# Patient Record
Sex: Female | Born: 1980 | Race: White | Hispanic: No | Marital: Single | Smoking: Never smoker
Health system: Southern US, Community
[De-identification: ages and names within clinical notes are randomized; demographics above are authoritative.]

## PROBLEM LIST (undated history)

## (undated) ENCOUNTER — Emergency Department (HOSPITAL_BASED_OUTPATIENT_CLINIC_OR_DEPARTMENT_OTHER): Payer: PRIVATE HEALTH INSURANCE

## (undated) DIAGNOSIS — F411 Generalized anxiety disorder: Secondary | ICD-10-CM

## (undated) DIAGNOSIS — T7840XA Allergy, unspecified, initial encounter: Secondary | ICD-10-CM

## (undated) DIAGNOSIS — K5090 Crohn's disease, unspecified, without complications: Secondary | ICD-10-CM

## (undated) DIAGNOSIS — F329 Major depressive disorder, single episode, unspecified: Secondary | ICD-10-CM

## (undated) DIAGNOSIS — E785 Hyperlipidemia, unspecified: Secondary | ICD-10-CM

## (undated) DIAGNOSIS — K219 Gastro-esophageal reflux disease without esophagitis: Secondary | ICD-10-CM

## (undated) HISTORY — DX: Major depressive disorder, single episode, unspecified: F32.9

## (undated) HISTORY — PX: ABDOMINAL HYSTERECTOMY: SHX81

## (undated) HISTORY — PX: BREAST SURGERY: SHX581

## (undated) HISTORY — DX: Allergy, unspecified, initial encounter: T78.40XA

## (undated) HISTORY — PX: CHOLECYSTECTOMY: SHX55

## (undated) HISTORY — DX: Generalized anxiety disorder: F41.1

## (undated) HISTORY — DX: Hyperlipidemia, unspecified: E78.5

---
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
## 2000-10-13 ENCOUNTER — Other Ambulatory Visit: Admission: RE | Admit: 2000-10-13 | Discharge: 2000-10-13 | Payer: Self-pay | Admitting: *Deleted

## 2001-06-28 DIAGNOSIS — F411 Generalized anxiety disorder: Secondary | ICD-10-CM

## 2001-06-28 HISTORY — DX: Generalized anxiety disorder: F41.1

## 2003-01-16 ENCOUNTER — Emergency Department (HOSPITAL_COMMUNITY): Admission: EM | Admit: 2003-01-16 | Discharge: 2003-01-16 | Payer: Self-pay | Admitting: Emergency Medicine

## 2003-05-09 ENCOUNTER — Other Ambulatory Visit: Admission: RE | Admit: 2003-05-09 | Discharge: 2003-05-09 | Payer: Self-pay | Admitting: Family Medicine

## 2003-08-04 ENCOUNTER — Ambulatory Visit (HOSPITAL_COMMUNITY): Admission: RE | Admit: 2003-08-04 | Discharge: 2003-08-04 | Payer: Self-pay | Admitting: Family Medicine

## 2004-02-29 DIAGNOSIS — F32A Depression, unspecified: Secondary | ICD-10-CM

## 2004-02-29 HISTORY — DX: Depression, unspecified: F32.A

## 2004-03-22 ENCOUNTER — Emergency Department (HOSPITAL_COMMUNITY): Admission: EM | Admit: 2004-03-22 | Discharge: 2004-03-23 | Payer: Self-pay | Admitting: Emergency Medicine

## 2004-10-25 ENCOUNTER — Other Ambulatory Visit: Admission: RE | Admit: 2004-10-25 | Discharge: 2004-10-25 | Payer: Self-pay | Admitting: Family Medicine

## 2005-08-10 ENCOUNTER — Ambulatory Visit: Payer: Self-pay | Admitting: Family Medicine

## 2005-08-18 ENCOUNTER — Ambulatory Visit: Payer: Self-pay | Admitting: Family Medicine

## 2005-08-18 ENCOUNTER — Other Ambulatory Visit: Admission: RE | Admit: 2005-08-18 | Discharge: 2005-08-18 | Payer: Self-pay | Admitting: Family Medicine

## 2005-08-18 LAB — HM PAP SMEAR: HM Pap smear: NEGATIVE

## 2006-05-15 ENCOUNTER — Ambulatory Visit: Payer: Self-pay | Admitting: Family Medicine

## 2006-11-06 ENCOUNTER — Emergency Department (HOSPITAL_COMMUNITY): Admission: EM | Admit: 2006-11-06 | Discharge: 2006-11-06 | Payer: Self-pay | Admitting: Emergency Medicine

## 2007-06-07 ENCOUNTER — Ambulatory Visit: Payer: Self-pay | Admitting: Family Medicine

## 2007-12-15 ENCOUNTER — Emergency Department (HOSPITAL_BASED_OUTPATIENT_CLINIC_OR_DEPARTMENT_OTHER): Admission: EM | Admit: 2007-12-15 | Discharge: 2007-12-15 | Payer: Self-pay | Admitting: Emergency Medicine

## 2008-01-06 IMAGING — CT CT PELVIS W/ CM
2 of 5 series · 17 of 46 positions shown, 19 images · IV contrast (APPLIED)
Comparison: None.

CLINICAL DATA: Abdominal pain.  Nausea.  
 ABDOMEN CT WITH CONTRAST:
TECHNIQUE: Multidetector CT imaging of the abdomen was performed following the standard protocol during bolus administration of intravenous contrast.
 Contrast:  100 cc Omnipaque 300
TECHNIQUE: Multidetector CT imaging of the pelvis was performed following the standard protocol during bolus administration of intravenous contrast.

[Series 2: abd/pelv with 5.0 b31f st · axial · 0.71mm/px · z∈[-464,-24]mm · 14 of 100 slices shown, 16 images]
[im 6/100  soft-tissue]
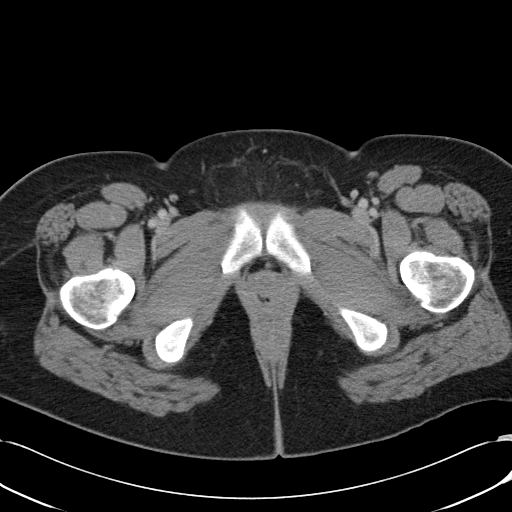
[im 6/100  bone]
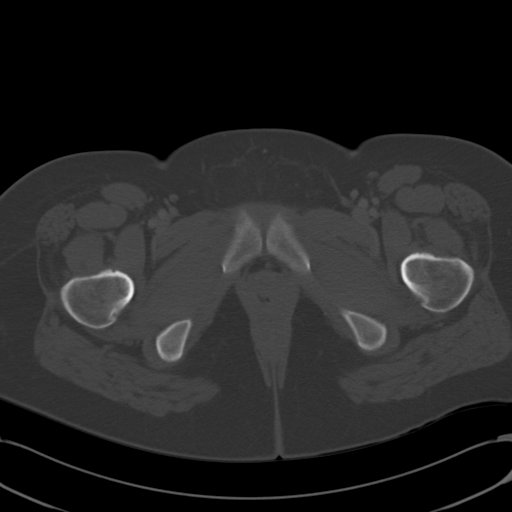
[im 12/100  soft-tissue]
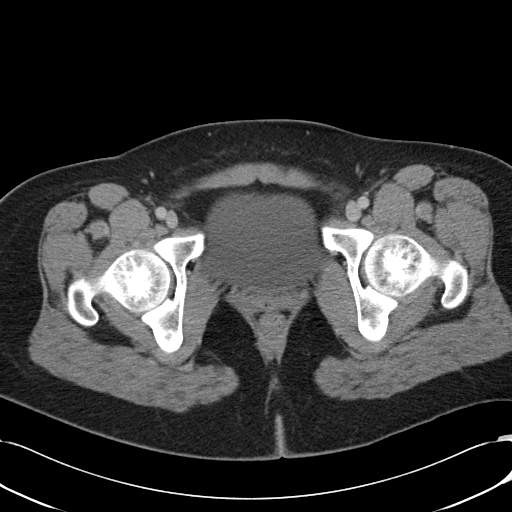
[im 23/100  soft-tissue]
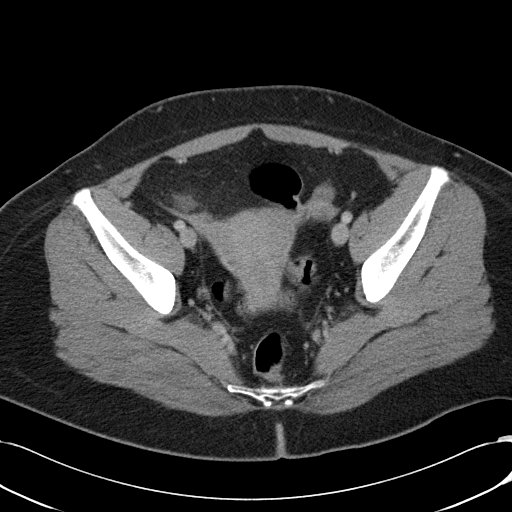
[im 28/100  soft-tissue]
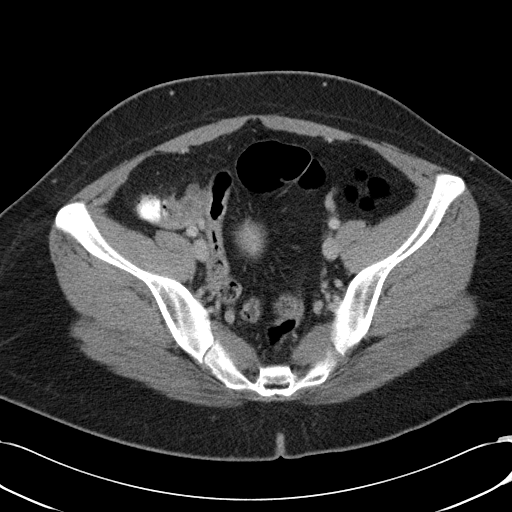
[im 34/100  soft-tissue]
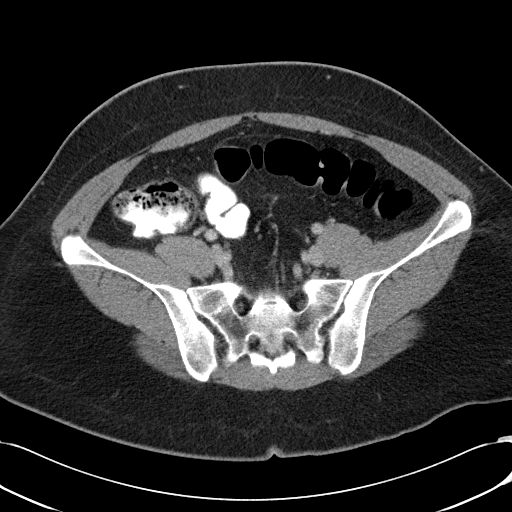
[im 39/100  soft-tissue]
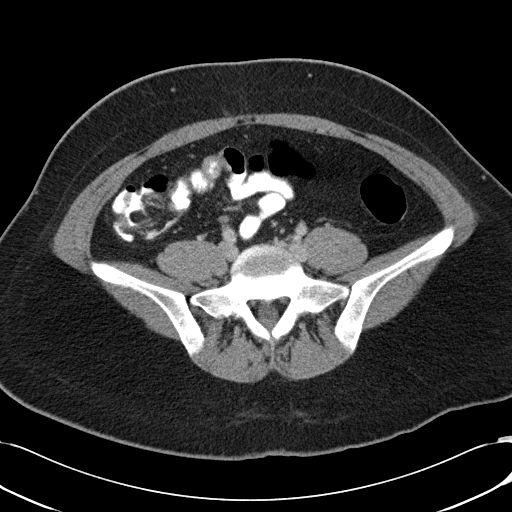
[im 45/100  soft-tissue]
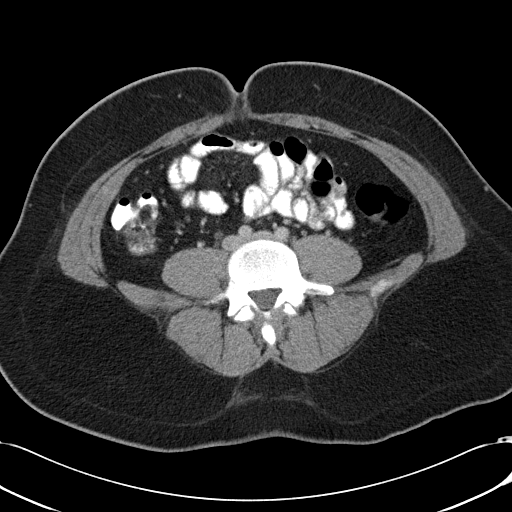
[im 56/100  soft-tissue]
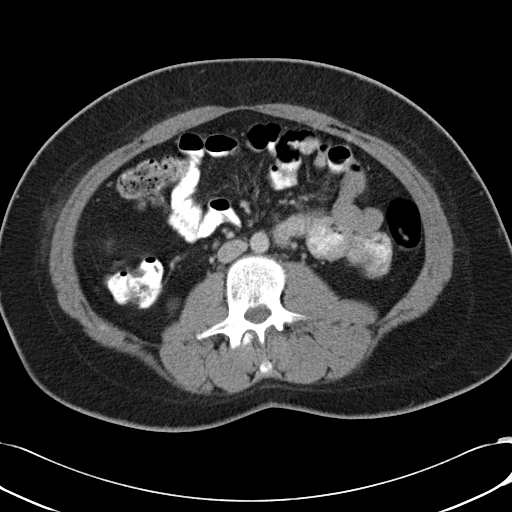
[im 61/100  soft-tissue]
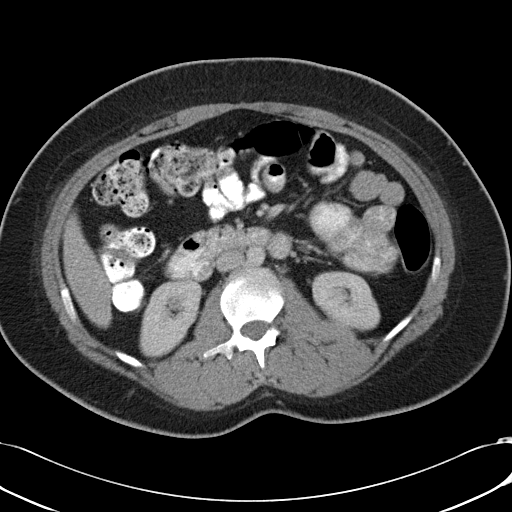
[im 61/100  bone]
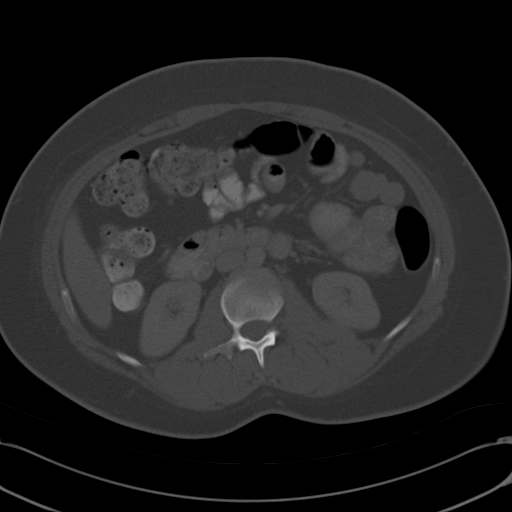
[im 67/100  soft-tissue]
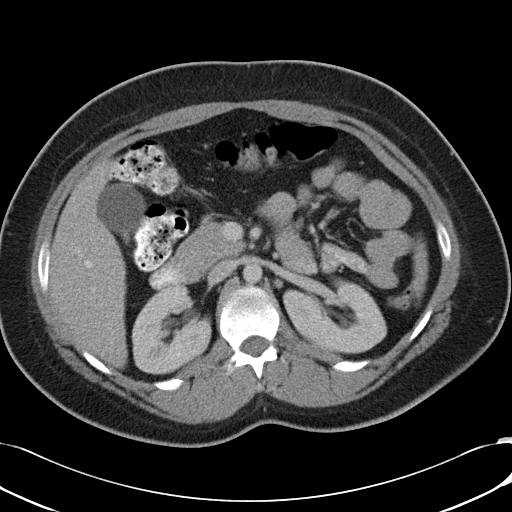
[im 72/100  soft-tissue]
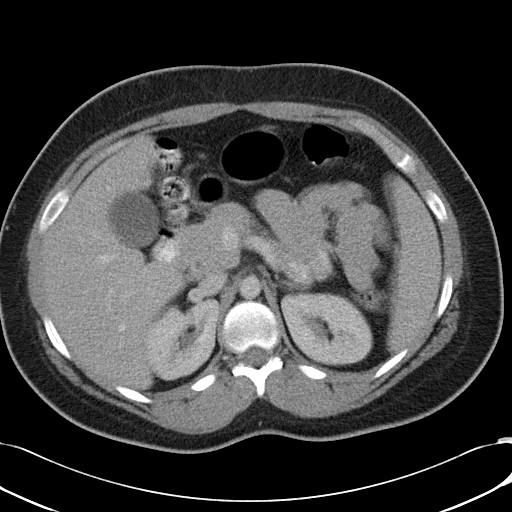
[im 78/100  soft-tissue]
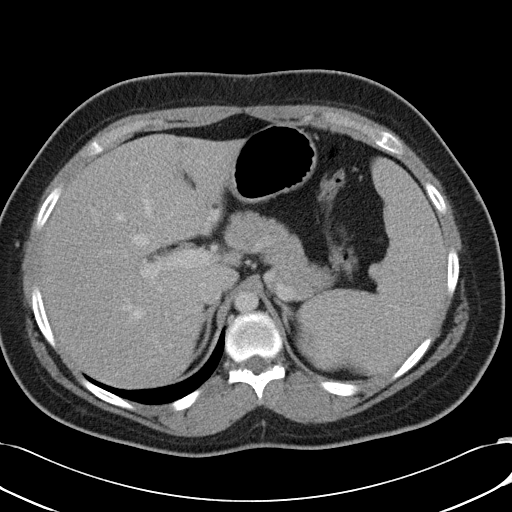
[im 89/100  soft-tissue]
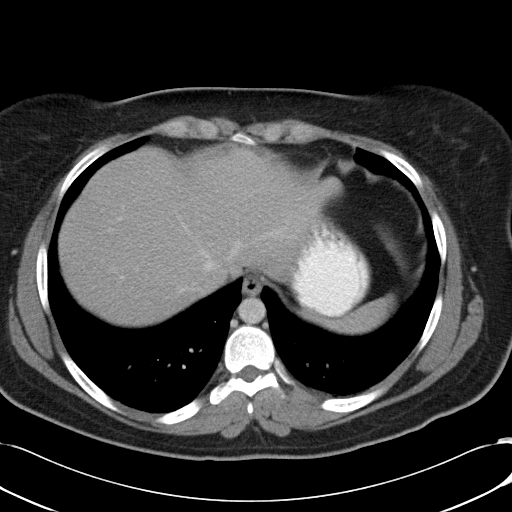
[im 94/100  soft-tissue]
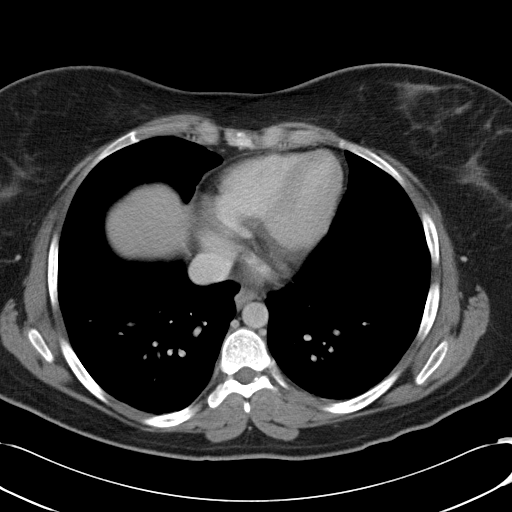

[Series 602: cor · coronal · 0.98mm/px · 3 of 112 slices shown]
[im 38/112  soft-tissue]
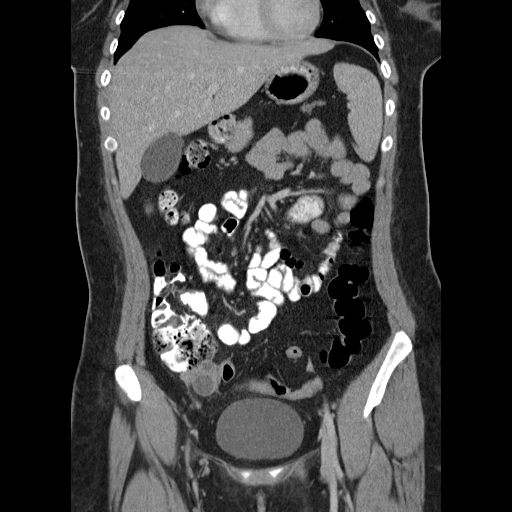
[im 50/112  soft-tissue]
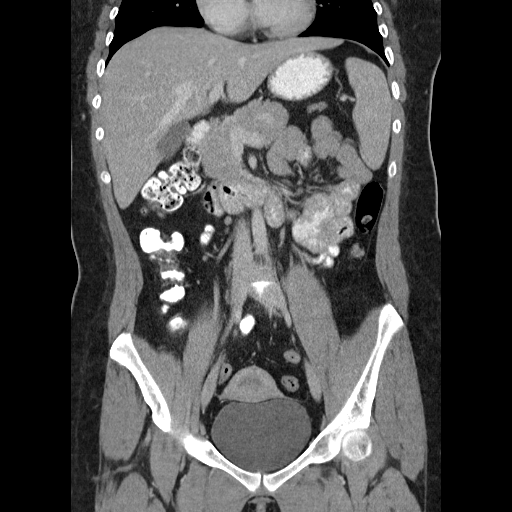
[im 62/112  soft-tissue]
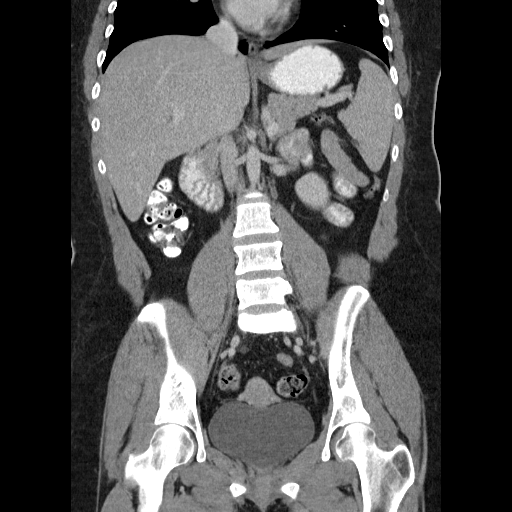

[17 of 46 positions shown; findings below may reference images not displayed]

FINDINGS: Lung bases are clear.  No pleural or pericardial fluid.  The liver has a normal appearance without focal lesions or biliary ductal dilatation.  No calcified gallstones are seen.  The spleen is normal.  Pancreas is normal.  The adrenal glands are normal.  The kidneys are normal.  The aorta and IVC are normal.  No retroperitoneal mass or adenopathy.  No free intraperitoneal fluid or air.  No bowel pathology is seen in the abdominal portion of the scan.
IMPRESSION: Normal CT scan of the abdomen.
 PELVIS CT WITH CONTRAST:
FINDINGS: No free fluid.  The uterus and adnexal regions appear normal.  No bowel pathology is seen.  The appendix is well seen and is normal.
IMPRESSION: Normal CT scan of the pelvis.

## 2008-02-17 ENCOUNTER — Emergency Department (HOSPITAL_BASED_OUTPATIENT_CLINIC_OR_DEPARTMENT_OTHER): Admission: EM | Admit: 2008-02-17 | Discharge: 2008-02-17 | Payer: Self-pay | Admitting: Emergency Medicine

## 2008-05-21 ENCOUNTER — Ambulatory Visit: Payer: Self-pay | Admitting: Family Medicine

## 2008-07-15 ENCOUNTER — Ambulatory Visit: Payer: Self-pay | Admitting: Family Medicine

## 2008-10-02 ENCOUNTER — Ambulatory Visit: Payer: Self-pay | Admitting: Family Medicine

## 2009-04-23 ENCOUNTER — Ambulatory Visit: Payer: Self-pay | Admitting: Family Medicine

## 2009-12-09 ENCOUNTER — Ambulatory Visit: Payer: Self-pay | Admitting: Family Medicine

## 2010-09-16 ENCOUNTER — Encounter: Payer: Self-pay | Admitting: Family Medicine

## 2010-12-10 LAB — POCT PREGNANCY, URINE: Operator id: 151321

## 2010-12-10 LAB — CBC
HCT: 37.1
MCHC: 33.8
MCV: 83.6
RBC: 4.44

## 2010-12-10 LAB — URINALYSIS, ROUTINE W REFLEX MICROSCOPIC
Bilirubin Urine: NEGATIVE
Glucose, UA: NEGATIVE
Hgb urine dipstick: NEGATIVE
Specific Gravity, Urine: 1.029
Urobilinogen, UA: 1

## 2010-12-10 LAB — DIFFERENTIAL
Basophils Relative: 0
Eosinophils Absolute: 0.1
Eosinophils Relative: 1
Monocytes Absolute: 0.9 — ABNORMAL HIGH
Monocytes Relative: 6
Neutrophils Relative %: 81 — ABNORMAL HIGH

## 2010-12-10 LAB — WET PREP, GENITAL: Trich, Wet Prep: NONE SEEN

## 2010-12-10 LAB — GC/CHLAMYDIA PROBE AMP, GENITAL
Chlamydia, DNA Probe: NEGATIVE
GC Probe Amp, Genital: NEGATIVE

## 2021-03-06 ENCOUNTER — Other Ambulatory Visit: Payer: Self-pay

## 2021-03-06 DIAGNOSIS — R112 Nausea with vomiting, unspecified: Secondary | ICD-10-CM | POA: Insufficient documentation

## 2021-03-06 DIAGNOSIS — R1013 Epigastric pain: Secondary | ICD-10-CM | POA: Diagnosis not present

## 2021-03-06 DIAGNOSIS — Z79899 Other long term (current) drug therapy: Secondary | ICD-10-CM | POA: Insufficient documentation

## 2021-03-06 DIAGNOSIS — R197 Diarrhea, unspecified: Secondary | ICD-10-CM | POA: Insufficient documentation

## 2021-03-07 ENCOUNTER — Encounter (HOSPITAL_BASED_OUTPATIENT_CLINIC_OR_DEPARTMENT_OTHER): Payer: Self-pay | Admitting: Emergency Medicine

## 2021-03-07 ENCOUNTER — Emergency Department (HOSPITAL_BASED_OUTPATIENT_CLINIC_OR_DEPARTMENT_OTHER)
Admission: EM | Admit: 2021-03-07 | Discharge: 2021-03-07 | Disposition: A | Discharge status: Home / Self Care | Payer: PRIVATE HEALTH INSURANCE | Attending: Emergency Medicine | Admitting: Emergency Medicine

## 2021-03-07 DIAGNOSIS — R112 Nausea with vomiting, unspecified: Secondary | ICD-10-CM

## 2021-03-07 DIAGNOSIS — R1013 Epigastric pain: Secondary | ICD-10-CM

## 2021-03-07 HISTORY — DX: Gastro-esophageal reflux disease without esophagitis: K21.9

## 2021-03-07 HISTORY — DX: Crohn's disease, unspecified, without complications: K50.90

## 2021-03-07 LAB — CBC
HCT: 37.6 % (ref 36.0–46.0)
Hemoglobin: 12.1 g/dL (ref 12.0–15.0)
MCH: 26.9 pg (ref 26.0–34.0)
MCHC: 32.2 g/dL (ref 30.0–36.0)
MCV: 83.6 fL (ref 80.0–100.0)
Platelets: 262 10*3/uL (ref 150–400)
RBC: 4.5 MIL/uL (ref 3.87–5.11)
RDW: 14.2 % (ref 11.5–15.5)
WBC: 11.5 10*3/uL — ABNORMAL HIGH (ref 4.0–10.5)
nRBC: 0 % (ref 0.0–0.2)

## 2021-03-07 LAB — COMPREHENSIVE METABOLIC PANEL
ALT: 16 U/L (ref 0–44)
AST: 13 U/L — ABNORMAL LOW (ref 15–41)
Albumin: 4.6 g/dL (ref 3.5–5.0)
Alkaline Phosphatase: 95 U/L (ref 38–126)
Anion gap: 12 (ref 5–15)
BUN: 20 mg/dL (ref 6–20)
CO2: 19 mmol/L — ABNORMAL LOW (ref 22–32)
Calcium: 9.4 mg/dL (ref 8.9–10.3)
Chloride: 105 mmol/L (ref 98–111)
Creatinine, Ser: 1.06 mg/dL — ABNORMAL HIGH (ref 0.44–1.00)
GFR, Estimated: >60 mL/min (ref >60)
Glucose, Bld: 141 mg/dL — ABNORMAL HIGH (ref 70–99)
Potassium: 4 mmol/L (ref 3.5–5.1)
Sodium: 136 mmol/L (ref 135–145)
Total Bilirubin: 1 mg/dL (ref 0.3–1.2)
Total Protein: 7.6 g/dL (ref 6.5–8.1)

## 2021-03-07 LAB — PREGNANCY, URINE: Preg Test, Ur: NEGATIVE

## 2021-03-07 LAB — LIPASE, BLOOD: Lipase: 56 U/L — ABNORMAL HIGH (ref 11–51)

## 2021-03-07 MED ORDER — LACTATED RINGERS IV BOLUS
1000.0000 mL | Freq: Once | INTRAVENOUS | Status: AC
  Administered 2021-03-07: 1000 mL via INTRAVENOUS

## 2021-03-07 MED ORDER — FENTANYL CITRATE PF 50 MCG/ML IJ SOSY
100.0000 ug | PREFILLED_SYRINGE | Freq: Once | INTRAMUSCULAR | Status: AC | PRN
Start: 2021-03-07 — End: 2021-03-07
  Administered 2021-03-07: 100 ug via INTRAVENOUS
  Filled 2021-03-07: qty 2

## 2021-03-07 MED ORDER — SODIUM CHLORIDE 0.9 % IV SOLN
12.5000 mg | Freq: Once | INTRAVENOUS | Status: AC
  Administered 2021-03-07: 12.5 mg via INTRAVENOUS
  Filled 2021-03-07: qty 0.5

## 2021-03-07 MED ORDER — PROMETHAZINE HCL 25 MG/ML IJ SOLN
INTRAMUSCULAR | Status: AC
  Filled 2021-03-07: qty 1

## 2021-03-07 MED ORDER — ONDANSETRON HCL 4 MG/2ML IJ SOLN
4.0000 mg | Freq: Once | INTRAMUSCULAR | Status: AC
  Administered 2021-03-07: 4 mg via INTRAVENOUS
  Filled 2021-03-07: qty 2

## 2021-03-07 MED ORDER — PROMETHAZINE HCL 25 MG PO TABS
25.0000 mg | ORAL_TABLET | Freq: Four times a day (QID) | ORAL | 0 refills | Status: AC | PRN
Start: 2021-03-07 — End: ?

## 2021-03-07 NOTE — ED Provider Notes (Signed)
[facility] Provider Note: Georgena Spurling, MD, FACEP  CSN: [id number] MRN: [id number] ARRIVAL: 03/06/21 at 2353 ROOM: DB013/DB013   CHIEF COMPLAINT  Abdominal Pain   HISTORY OF PRESENT ILLNESS  03/07/21 2:29 AM Ann Leonard is a 41 y.o. female with a history of Crohn's disease. She has had epigastric abdominal pain for about the last 3 weeks.  The pain has been waxing and waning.  She describes it as kind of sharp and pulling.  She had an abdominal CT on 03/02/2021 which was unremarkable.  She has been taking hyoscyamine without relief.  Yesterday the pain acutely worsened and she now rates it as an 8 out of 10.  It is somewhat worse with palpation.  She has had associated nausea, vomiting and diarrhea which her symptoms she did not previously have.  Her temperature was noted to be 99.7 on arrival.  She has been taking Zofran without relief of the nausea and vomiting.   Past Medical History:  Diagnosis Date   Allergy    RHINITIS   Contraceptive management    Crohn disease ([place])    Depression 02/29/2004   Elevated lipids    GAD (generalized anxiety disorder) 06/28/2001   GERD (gastroesophageal reflux disease)     History reviewed. No pertinent surgical history.  History reviewed. No pertinent family history.     Prior to Admission medications   Medication Sig Start Date End Date Taking? Authorizing Provider  promethazine (PHENERGAN) 25 MG tablet Take 1 tablet (25 mg total) by mouth every 6 (six) hours as needed for nausea or vomiting. 03/07/21  Yes Marilena Trevathan, MD  buPROPion (WELLBUTRIN XL) 150 MG 24 hr tablet Take 150 mg by mouth 2 (two) times daily.      [provider]  escitalopram (LEXAPRO) 10 MG tablet Take 10 mg by mouth daily.      [provider]  norethindrone-ethinyl estradiol (JUNEL FE 1/20) 1-20 MG-MCG per tablet Take 1 tablet by mouth daily.      [provider]    Allergies Patient has no known allergies.   REVIEW OF  SYSTEMS  Negative except as noted here or in the History of Present Illness.   PHYSICAL EXAMINATION  Initial Vital Signs Blood pressure 121/74, pulse 93, temperature 99.7 F (37.6 C), temperature source Oral, resp. rate 18, height 5\' 5"  (1.651 m), weight 93.9 kg, SpO2 99 %.  Examination General: Well-developed, well-nourished female in no acute distress; appearance consistent with age of record HENT: normocephalic; atraumatic Eyes: Normal appearance Neck: supple Heart: regular rate and rhythm Lungs: clear to auscultation bilaterally Abdomen: soft; nondistended; epigastric tenderness; bowel sounds present Extremities: No deformity; full range of motion; pulses normal Neurologic: Awake, alert and oriented; motor function intact in all extremities and symmetric; no facial droop Skin: Warm and dry Psychiatric: Normal mood and affect   RESULTS  Summary of this visit's results, reviewed and interpreted by myself:   EKG Interpretation  Date/Time:  Sunday March 07 2021 00:35:19 EST Ventricular Rate:  92 PR Interval:  146 QRS Duration: 74 QT Interval:  348 QTC Calculation: 430 R Axis:   0 Text Interpretation: Normal sinus rhythm Normal ECG No previous ECGs available Confirmed by Huck Ashworth, Jenny Reichmann [phone]) on 03/07/2021 12:50:05 AM       Laboratory Studies: Results for orders placed or performed during the hospital encounter of 03/07/21 (from the past 24 hour(s))  Pregnancy, urine     Status: None   Collection Time: 03/07/21 12:29 AM  Result Value Ref Range   Preg Test, Ur NEGATIVE NEGATIVE  Lipase, blood     Status: Abnormal   Collection Time: 03/07/21 12:40 AM  Result Value Ref Range   Lipase 56 (H) 11 - 51 U/L  Comprehensive metabolic panel     Status: Abnormal   Collection Time: 03/07/21 12:40 AM  Result Value Ref Range   Sodium 136 135 - 145 mmol/L   Potassium 4.0 3.5 - 5.1 mmol/L   Chloride 105 98 - 111 mmol/L   CO2 19 (L) 22 - 32 mmol/L   Glucose, Bld 141 (H) 70 - 99  mg/dL   BUN 20 6 - 20 mg/dL   Creatinine, Ser 1.06 (H) 0.44 - 1.00 mg/dL   Calcium 9.4 8.9 - 10.3 mg/dL   Total Protein 7.6 6.5 - 8.1 g/dL   Albumin 4.6 3.5 - 5.0 g/dL   AST 13 (L) 15 - 41 U/L   ALT 16 0 - 44 U/L   Alkaline Phosphatase 95 38 - 126 U/L   Total Bilirubin 1.0 0.3 - 1.2 mg/dL   GFR, Estimated >60 >60 mL/min   Anion gap 12 5 - 15  CBC     Status: Abnormal   Collection Time: 03/07/21 12:40 AM  Result Value Ref Range   WBC 11.5 (H) 4.0 - 10.5 K/uL   RBC 4.50 3.87 - 5.11 MIL/uL   Hemoglobin 12.1 12.0 - 15.0 g/dL   HCT 37.6 36.0 - 46.0 %   MCV 83.6 80.0 - 100.0 fL   MCH 26.9 26.0 - 34.0 pg   MCHC 32.2 30.0 - 36.0 g/dL   RDW 14.2 11.5 - 15.5 %   Platelets 262 150 - 400 K/uL   nRBC 0.0 0.0 - 0.2 %   Imaging Studies: No results found.  ED COURSE and MDM  Nursing notes, initial and subsequent vitals signs, including pulse oximetry, reviewed and interpreted by myself.  Vitals:   03/07/21 0027 03/07/21 0300  BP: 121/74 120/68  Pulse: 93 77  Resp: 18 (!) 28  Temp: 99.7 F (37.6 C)   TempSrc: Oral   SpO2: 99% 97%  Weight: 93.9 kg   Height: 5\' 5"  (1.651 m)    Medications  promethazine (PHENERGAN) 25 MG/ML injection (has no administration in time range)  lactated ringers bolus 1,000 mL (1,000 mLs Intravenous New Bag/Given 03/07/21 0303)  ondansetron (ZOFRAN) injection 4 mg (4 mg Intravenous Given 03/07/21 0304)  fentaNYL (SUBLIMAZE) injection 100 mcg (100 mcg Intravenous Given 03/07/21 0304)  promethazine (PHENERGAN) 12.5 mg in sodium chloride 0.9 % 50 mL IVPB (12.5 mg Intravenous New Bag/Given 03/07/21 0415)   4:42 AM Nausea controlled with Zofran and Phenergan.  Patient was given fentanyl 100 mcg earlier.  Her pain is now minimal and she has minimal epigastric tenderness.  Although her lipase is slightly elevated at 56 her pain does not radiate into her back and I have a low suspicion of this being pancreatitis.  Also she had a CT scan several days ago which showed a  normal-appearing pancreas in the setting of persistent pain.  I do not feel that she needs another CT scan at this time.  She would like to go home at this time and we will provide a Phenergan prescription (she has Zofran at home).  She was advised to return if pain or other symptoms worsen.  The nausea, vomiting and diarrhea could represent a gastrointestinal virus.  The focality of her pain makes me think it less likely this  is a Crohn's flare especially given her unremarkable recent CT scan in the setting of persistent pain.  PROCEDURES  Procedures   ED DIAGNOSES     ICD-10-CM   1. Epigastric pain  R10.13     2. Nausea vomiting and diarrhea  R11.2    R19.7          Sabian Kuba, Jenny Reichmann, MD 03/07/21 [phone]

## 2021-03-07 NOTE — ED Triage Notes (Signed)
Pt presents from home for upper abd pain, emesis x4, diarrhea. Has been experiencing abd pain for 3 weeks, previous blood work and CT done for same, showed periumbilical hernia. N/V/D new today. Denies blood in stool, noticed blood tinged emesis.  Has tried hyoscyamine and zofran without relief. Last PO intake 6pm. H/o GERD, Chrohn's

## 2021-05-13 ENCOUNTER — Other Ambulatory Visit: Payer: Self-pay

## 2021-05-13 ENCOUNTER — Emergency Department (HOSPITAL_BASED_OUTPATIENT_CLINIC_OR_DEPARTMENT_OTHER): Payer: PRIVATE HEALTH INSURANCE

## 2021-05-13 ENCOUNTER — Encounter (HOSPITAL_BASED_OUTPATIENT_CLINIC_OR_DEPARTMENT_OTHER): Payer: Self-pay | Admitting: Emergency Medicine

## 2021-05-13 ENCOUNTER — Emergency Department (HOSPITAL_BASED_OUTPATIENT_CLINIC_OR_DEPARTMENT_OTHER)
Admission: EM | Admit: 2021-05-13 | Discharge: 2021-05-14 | Disposition: A | Discharge status: Home / Self Care | Payer: PRIVATE HEALTH INSURANCE | Attending: Emergency Medicine | Admitting: Emergency Medicine

## 2021-05-13 DIAGNOSIS — R1013 Epigastric pain: Secondary | ICD-10-CM | POA: Insufficient documentation

## 2021-05-13 DIAGNOSIS — R197 Diarrhea, unspecified: Secondary | ICD-10-CM | POA: Insufficient documentation

## 2021-05-13 DIAGNOSIS — R110 Nausea: Secondary | ICD-10-CM | POA: Insufficient documentation

## 2021-05-13 DIAGNOSIS — K219 Gastro-esophageal reflux disease without esophagitis: Secondary | ICD-10-CM | POA: Insufficient documentation

## 2021-05-13 LAB — URINALYSIS, ROUTINE W REFLEX MICROSCOPIC
Bilirubin Urine: NEGATIVE
Glucose, UA: NEGATIVE mg/dL
Ketones, ur: NEGATIVE mg/dL
Leukocytes,Ua: NEGATIVE
Nitrite: NEGATIVE
Protein, ur: 30 mg/dL — AB
RBC / HPF: >50 RBC/hpf — ABNORMAL HIGH (ref 0–5)
Specific Gravity, Urine: 1.034 — ABNORMAL HIGH (ref 1.005–1.030)
pH: 5.5 (ref 5.0–8.0)

## 2021-05-13 LAB — COMPREHENSIVE METABOLIC PANEL
ALT: 11 U/L (ref 0–44)
AST: 14 U/L — ABNORMAL LOW (ref 15–41)
Albumin: 4.7 g/dL (ref 3.5–5.0)
Alkaline Phosphatase: 92 U/L (ref 38–126)
Anion gap: 9 (ref 5–15)
BUN: 20 mg/dL (ref 6–20)
CO2: 23 mmol/L (ref 22–32)
Calcium: 9.8 mg/dL (ref 8.9–10.3)
Chloride: 105 mmol/L (ref 98–111)
Creatinine, Ser: 0.9 mg/dL (ref 0.44–1.00)
GFR, Estimated: >60 mL/min (ref >60)
Glucose, Bld: 89 mg/dL (ref 70–99)
Potassium: 3.9 mmol/L (ref 3.5–5.1)
Sodium: 137 mmol/L (ref 135–145)
Total Bilirubin: 1.5 mg/dL — ABNORMAL HIGH (ref 0.3–1.2)
Total Protein: 8.1 g/dL (ref 6.5–8.1)

## 2021-05-13 LAB — CBC
HCT: 41.6 % (ref 36.0–46.0)
Hemoglobin: 13.2 g/dL (ref 12.0–15.0)
MCH: 25.6 pg — ABNORMAL LOW (ref 26.0–34.0)
MCHC: 31.7 g/dL (ref 30.0–36.0)
MCV: 80.8 fL (ref 80.0–100.0)
Platelets: 233 10*3/uL (ref 150–400)
RBC: 5.15 MIL/uL — ABNORMAL HIGH (ref 3.87–5.11)
RDW: 15.7 % — ABNORMAL HIGH (ref 11.5–15.5)
WBC: 6.7 10*3/uL (ref 4.0–10.5)
nRBC: 0 % (ref 0.0–0.2)

## 2021-05-13 LAB — LIPASE, BLOOD: Lipase: 18 U/L (ref 11–51)

## 2021-05-13 LAB — PREGNANCY, URINE: Preg Test, Ur: NEGATIVE

## 2021-05-13 LAB — C DIFFICILE QUICK SCREEN W PCR REFLEX
C Diff antigen: NEGATIVE
C Diff interpretation: NOT DETECTED
C Diff toxin: NEGATIVE

## 2021-05-13 MED ORDER — KETOROLAC TROMETHAMINE 15 MG/ML IJ SOLN
15.0000 mg | Freq: Once | INTRAMUSCULAR | Status: AC
  Administered 2021-05-13: 15 mg via INTRAVENOUS
  Filled 2021-05-13: qty 1

## 2021-05-13 MED ORDER — IOHEXOL 300 MG/ML  SOLN
100.0000 mL | Freq: Once | INTRAMUSCULAR | Status: AC | PRN
  Administered 2021-05-13: 100 mL via INTRAVENOUS

## 2021-05-13 MED ORDER — LACTATED RINGERS IV BOLUS
1500.0000 mL | Freq: Once | INTRAVENOUS | Status: AC
Start: 2021-05-13 — End: 2021-05-13
  Administered 2021-05-13: 1500 mL via INTRAVENOUS

## 2021-05-13 MED ORDER — ONDANSETRON HCL 4 MG/2ML IJ SOLN
4.0000 mg | Freq: Once | INTRAMUSCULAR | Status: AC
  Administered 2021-05-13: 4 mg via INTRAVENOUS
  Filled 2021-05-13: qty 2

## 2021-05-13 NOTE — ED Provider Notes (Signed)
?[facility] EM[facility] DEPT ?Provider Note ? ? ?CSN: [id number] ?Arrival date & time: 05/13/21  1609 ? ?  ? ?History ? ?Chief Complaint  ?Patient presents with  ? Diarrhea  ? ? ?Ann Leonard is a 41 y.o. female. ? ?HPI ?Patient is a 41 year old female with a history of Crohn's disease on Entyvio as well as GERD who presents to the emergency department due to abdominal pain.  Patient states that she recently had a C. difficile exposure last week with her mother.  2 days ago she began developing upper abdominal pain as well as nausea and persistent diarrhea.  States that she has having greater than 10 bouts of watery brown diarrhea per day.  Also notes blood when wiping.  Denies any URI symptoms, vomiting, chest pain, shortness of breath, urinary complaints. ?  ? ?Home Medications ?Prior to Admission medications   ?Medication Sig Start Date End Date Taking? Authorizing Provider  ?buPROPion (WELLBUTRIN XL) 150 MG 24 hr tablet Take 150 mg by mouth 2 (two) times daily.      [provider]  ?escitalopram (LEXAPRO) 10 MG tablet Take 10 mg by mouth daily.      [provider]  ?norethindrone-ethinyl estradiol (JUNEL FE 1/20) 1-20 MG-MCG per tablet Take 1 tablet by mouth daily.      [provider]  ?promethazine (PHENERGAN) 25 MG tablet Take 1 tablet (25 mg total) by mouth every 6 (six) hours as needed for nausea or vomiting. 03/07/21   Molpus, Jonny Ruiz, MD  ?   ? ?Allergies    ?Topamax [topiramate]   ? ?Review of Systems   ?Review of Systems  ?All other systems reviewed and are negative. ?Ten systems reviewed and are negative for acute change, except as noted in the HPI.   ?Physical Exam ?Updated Vital Signs ?BP (!) 86/49 (BP Location: Right Arm)   Pulse 61   Temp 98.8 ?F (37.1 ?C) (Oral)   Resp 16   Ht 5\' 5"  (1.651 m)   Wt 89.8 kg   SpO2 98%   BMI 32.95 kg/m?  ?Physical Exam ?Vitals and nursing note reviewed.  ?Constitutional:   ?   General: She is not in acute distress. ?    Appearance: Normal appearance. She is not ill-appearing, toxic-appearing or diaphoretic.  ?HENT:  ?   Head: Normocephalic and atraumatic.  ?   Right Ear: External ear normal.  ?   Left Ear: External ear normal.  ?   Nose: Nose normal.  ?   Mouth/Throat:  ?   Mouth: Mucous membranes are moist.  ?   Pharynx: Oropharynx is clear. No oropharyngeal exudate or posterior oropharyngeal erythema.  ?Eyes:  ?   General: No scleral icterus.    ?   Right eye: No discharge.     ?   Left eye: No discharge.  ?   Extraocular Movements: Extraocular movements intact.  ?   Conjunctiva/sclera: Conjunctivae normal.  ?Cardiovascular:  ?   Rate and Rhythm: Normal rate and regular rhythm.  ?   Pulses: Normal pulses.  ?   Heart sounds: Normal heart sounds. No murmur heard. ?  No friction rub. No gallop.  ?Pulmonary:  ?   Effort: Pulmonary effort is normal. No respiratory distress.  ?   Breath sounds: Normal breath sounds. No stridor. No wheezing, rhonchi or rales.  ?Abdominal:  ?   General: Abdomen is flat.  ?   Palpations: Abdomen is soft.  ?   Tenderness: There is abdominal tenderness.  ?  Comments: Abdomen is flat and soft.  Mild to moderate tenderness noted overlying the epigastrium.  ?Musculoskeletal:     ?   General: Normal range of motion.  ?   Cervical back: Normal range of motion and neck supple. No tenderness.  ?Skin: ?   General: Skin is warm and dry.  ?Neurological:  ?   General: No focal deficit present.  ?   Mental Status: She is alert and oriented to person, place, and time.  ?Psychiatric:     ?   Mood and Affect: Mood normal.     ?   Behavior: Behavior normal.  ? ?ED Results / Procedures / Treatments   ?Labs ?(all labs ordered are listed, but only abnormal results are displayed) ?Labs Reviewed  ?COMPREHENSIVE METABOLIC PANEL - Abnormal; Notable for the following components:  ?    Result Value  ? AST 14 (*)   ? Total Bilirubin 1.5 (*)   ? All other components within normal limits  ?CBC - Abnormal; Notable for the following  components:  ? RBC 5.15 (*)   ? MCH 25.6 (*)   ? RDW 15.7 (*)   ? All other components within normal limits  ?URINALYSIS, ROUTINE W REFLEX MICROSCOPIC - Abnormal; Notable for the following components:  ? APPearance CLOUDY (*)   ? Specific Gravity, Urine 1.034 (*)   ? Hgb urine dipstick LARGE (*)   ? Protein, ur 30 (*)   ? RBC / HPF >50 (*)   ? All other components within normal limits  ?C DIFFICILE QUICK SCREEN W PCR REFLEX    ?GASTROINTESTINAL PANEL BY PCR, STOOL (REPLACES STOOL CULTURE)  ?LIPASE, BLOOD  ?PREGNANCY, URINE  ? ? ?EKG ?None ? ?Radiology ?CT ABDOMEN PELVIS W CONTRAST ? ?Result Date: 05/13/2021 ?CLINICAL DATA:  Abdominal pain, acute, nonlocalized. History of Crohn's disease. Epigastric pain with persistent diarrhea. EXAM: CT ABDOMEN AND PELVIS WITH CONTRAST TECHNIQUE: Multidetector CT imaging of the abdomen and pelvis was performed using the standard protocol following bolus administration of intravenous contrast. RADIATION DOSE REDUCTION: This exam was performed according to the departmental dose-optimization program which includes automated exposure control, adjustment of the mA and/or kV according to patient size and/or use of iterative reconstruction technique. CONTRAST:  100mL OMNIPAQUE IOHEXOL 300 MG/ML  SOLN COMPARISON:  11/06/2006.  03/23/2004 FINDINGS: Lower chest: Lung bases are clear. Hepatobiliary: Liver parenchyma is normal.  Previous cholecystectomy Pancreas: Normal Spleen: Upper limits of normal in size. No significant change since 2006. Adrenals/Urinary Tract: Adrenal glands are normal. Left kidney is normal. Right kidney is normal except for a 1 cm cyst in the upper pole. No stone disease. No hydronephrosis. The bladder is collapsed but normal. Stomach/Bowel: Stomach and small intestine are normal. No evidence of active Crohn's disease. Liquid stool within the colon. No evidence of colon inflammation, mass or obstruction. Vascular/Lymphatic: Aortic atherosclerosis, mild. No aneurysm. IVC  is normal. No adenopathy. Reproductive: IUD present within the uterus. No adnexal mass. 4.1 cm cyst associated with the left ovary. Other: No free fluid or air. Musculoskeletal: Normal except for mild spinal curvature. IMPRESSION: No sign of active Crohn's disease. Liquid stool in the colon consistent with diarrhea, without evidence colon inflammation. Previous cholecystectomy. IUD in place. 4.2 cm cyst associated with the left ovary. No follow-up imaging recommended. Note: This recommendation does not apply to premenarchal patients and to those with increased risk (genetic, family history, elevated tumor markers or other high-risk factors) of ovarian cancer. Reference: JACR 2020 Feb; 17(2):248-254 Electronically Signed  By: Paulina Fusi M.D.   On: 05/13/2021 18:41   ? ?Procedures ?Procedures  ? ?Medications Ordered in ED ?Medications  ?lactated ringers bolus 1,500 mL (0 mLs Intravenous Stopped 05/13/21 2145)  ?ondansetron [facility]) injection 4 mg (4 mg Intravenous Given 05/13/21 1817)  ?iohexol (OMNIPAQUE) 300 MG/ML solution 100 mL (100 mLs Intravenous Contrast Given 05/13/21 1833)  ? ? ?ED Course/ Medical Decision Making/ A&P ?  ?                        ?Medical Decision Making ?Amount and/or Complexity of Data Reviewed ?Labs: ordered. ?Radiology: ordered. ? ?Risk ?Prescription drug management. ? ?Pt is a 41 y.o. female with a history of Crohn's disease who presents to the emergency department due to diarrhea.  Symptoms started about 2 days ago.  Also reports epigastric pain as well as a previous C. difficile contact last week. ? ?Labs: ?CBC with RBCs of 5.15, MCH of 25.6, RDW of 15.7. ?CMP with an AST of 14 and a total bilirubin of 1.5. ?UA with large hemoglobin, 30 protein, greater than 50 RBCs. ?Urine pregnancy is negative. ?Lipase of 18. ?C. difficile quick screen with PCR reflex is in process. ?Gastrointestinal panel by PCR is in process. ? ?Imaging: ?CT scan of the abdomen/pelvis shows  IMPRESSION: No sign of  active Crohn's disease. Liquid stool in the colon consistent with diarrhea, without evidence colon inflammation. Previous cholecystectomy. IUD in place. 4.2 cm cyst associated with the left ovary. No follow-up i

## 2021-05-13 NOTE — ED Triage Notes (Signed)
Pt was exposed to a person who has c.diff. last Monday. Pt. Then developed watery stools with several episodes of diarrhea.  Pt was sent by UC due to same and dehydration. Pt also has chron's and on entyvio for same.  ?

## 2021-05-13 NOTE — Discharge Instructions (Signed)
Please check MyChart results regarding your C. difficile testing.  If positive you will need additional treatment.  Strongly recommend following up with your primary care doctor.  Come back to ER if you develop worsening vomiting, uncontrolled pain or other new concerning symptoms. ?

## 2021-05-14 LAB — GASTROINTESTINAL PANEL BY PCR, STOOL (REPLACES STOOL CULTURE)

## 2022-07-13 IMAGING — CT CT ABD-PELV W/ CM
2 of 5 series · 16 of 46 positions shown, 18 images · IV contrast (APPLIED)
Comparison: 11/06/2006.  03/23/2004

CLINICAL DATA: Abdominal pain, acute, nonlocalized. History of
Crohn's disease. Epigastric pain with persistent diarrhea.

EXAM:
CT ABDOMEN AND PELVIS WITH CONTRAST
TECHNIQUE: Multidetector CT imaging of the abdomen and pelvis was performed
using the standard protocol following bolus administration of
intravenous contrast.

[Series 2: abd pel w · axial · 0.90mm/px · z∈[+983,+1433]mm · 13 of 102 slices shown, 15 images]
[im 6/102  soft-tissue]
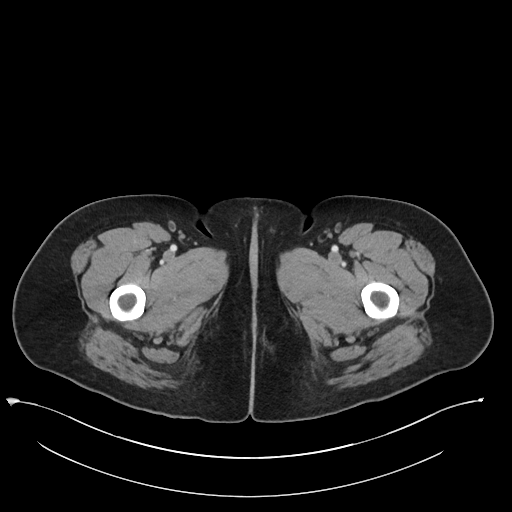
[im 6/102  bone]
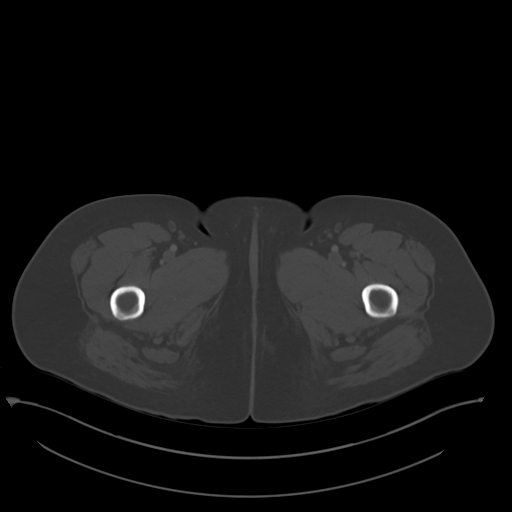
[im 16/102  soft-tissue]
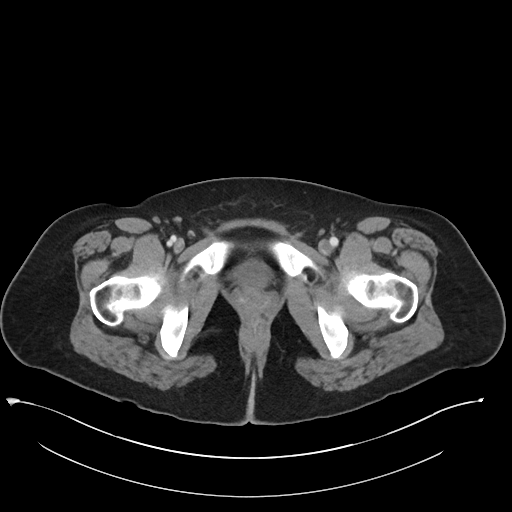
[im 21/102  soft-tissue]
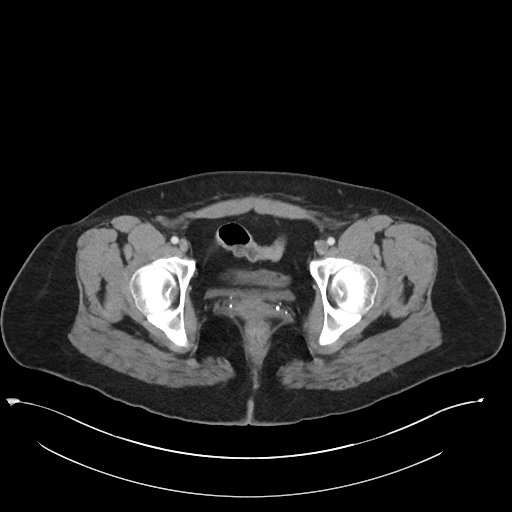
[im 31/102  soft-tissue]
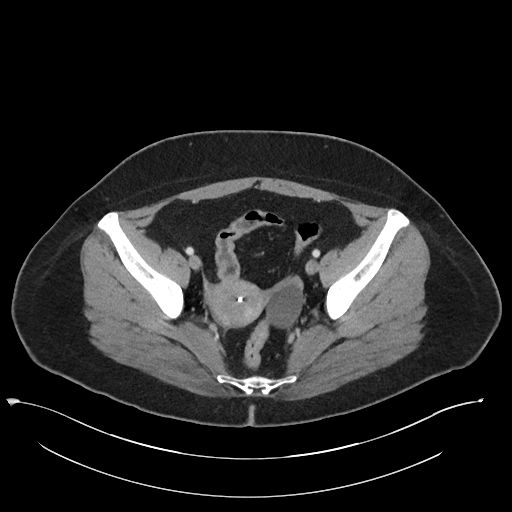
[im 36/102  soft-tissue]
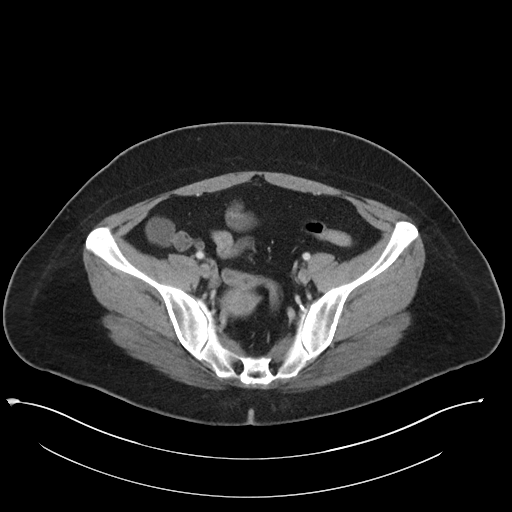
[im 46/102  soft-tissue]
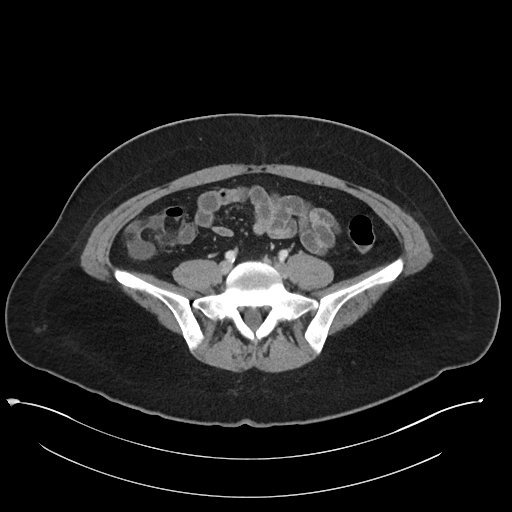
[im 51/102  soft-tissue]
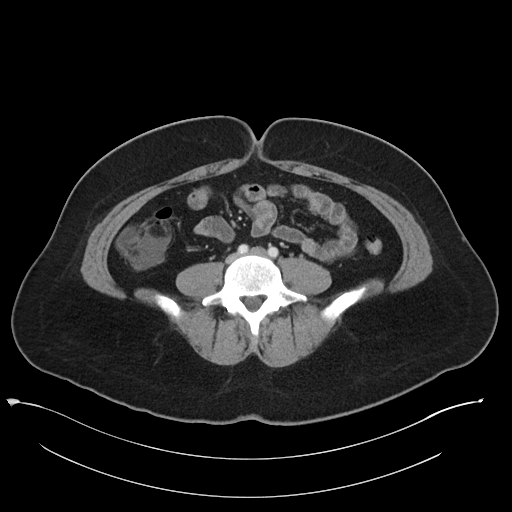
[im 56/102  soft-tissue]
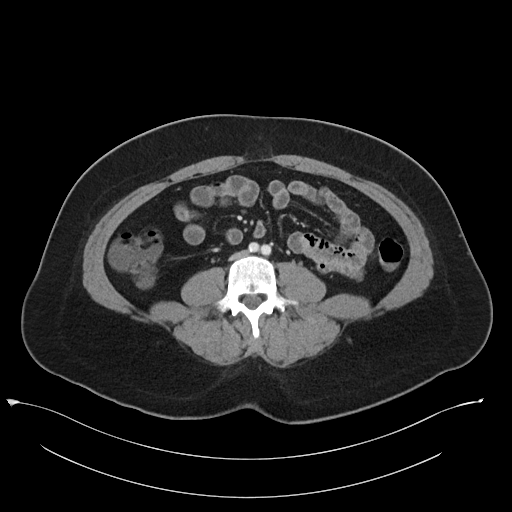
[im 66/102  soft-tissue]
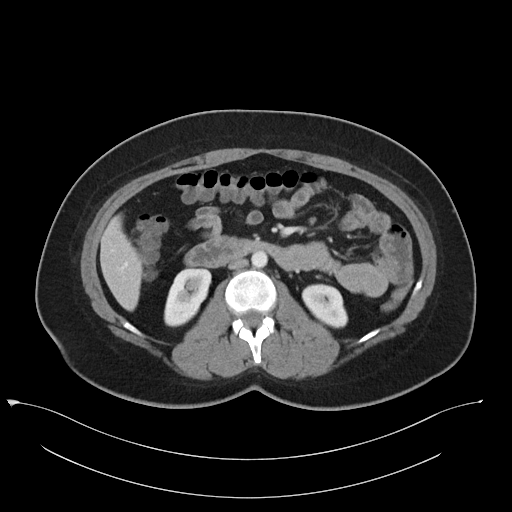
[im 66/102  bone]
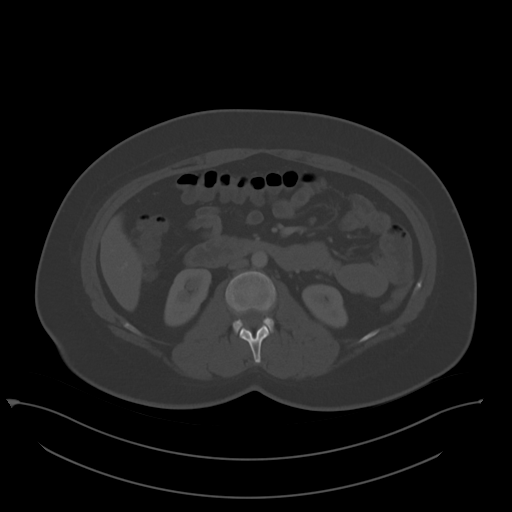
[im 71/102  soft-tissue]
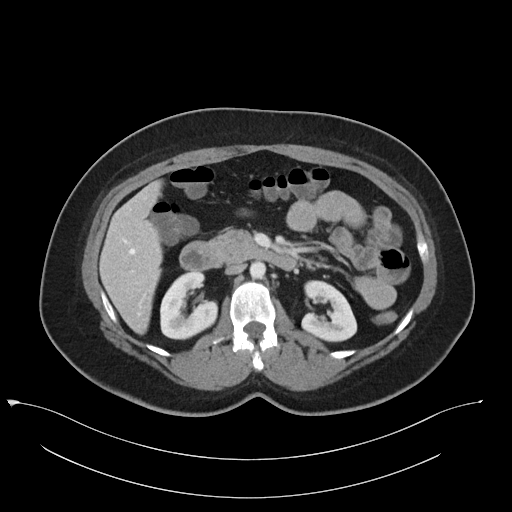
[im 81/102  soft-tissue]
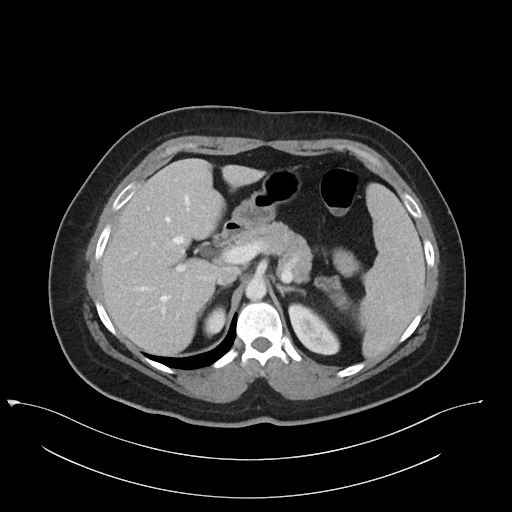
[im 86/102  soft-tissue]
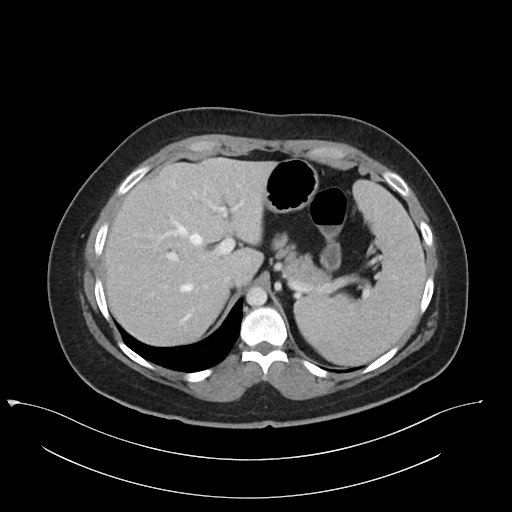
[im 96/102  soft-tissue]
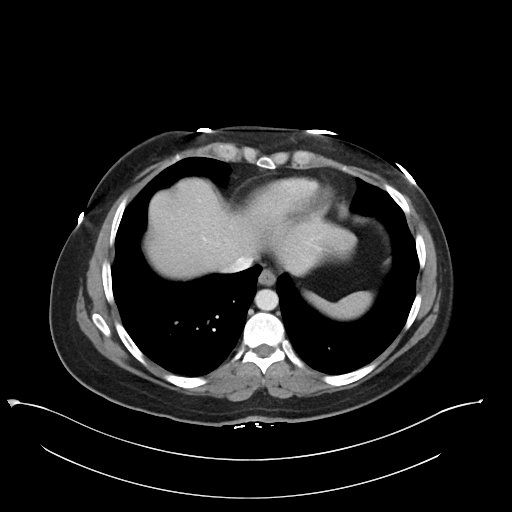

[Series 5: coronal · coronal · 0.89mm/px · 3 of 98 slices shown]
[im 33/98  soft-tissue]
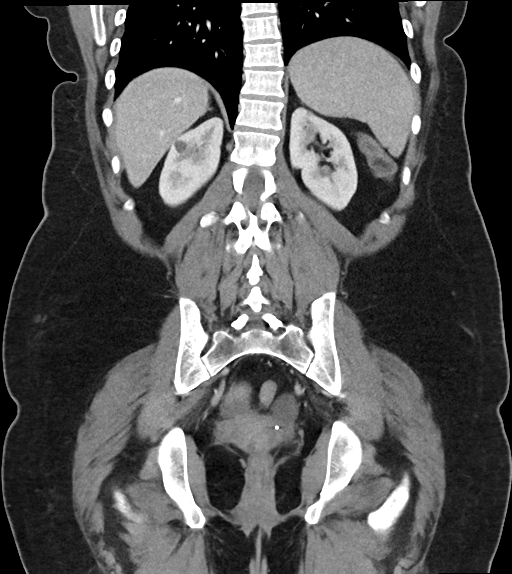
[im 44/98  soft-tissue]
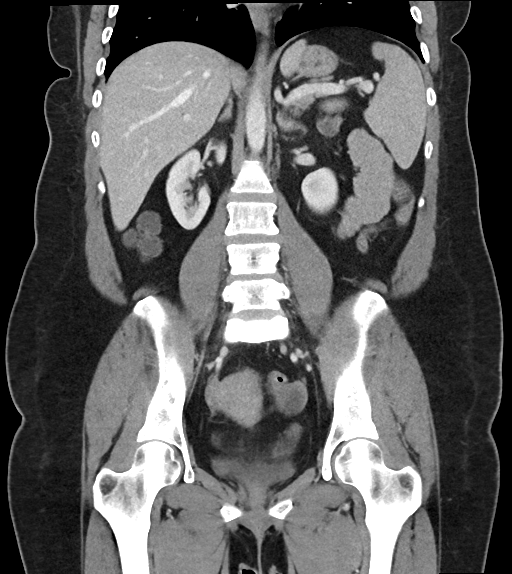
[im 54/98  soft-tissue]
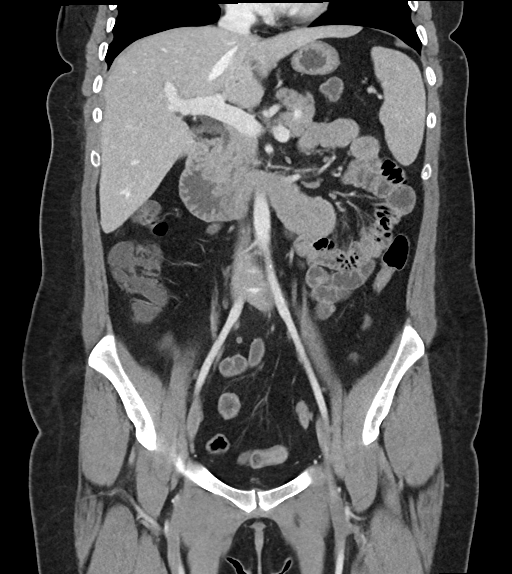

[16 of 46 positions shown; findings below may reference images not displayed]

RADIATION DOSE REDUCTION: This exam was performed according to the
departmental dose-optimization program which includes automated
exposure control, adjustment of the mA and/or kV according to
patient size and/or use of iterative reconstruction technique.

CONTRAST:  100mL OMNIPAQUE IOHEXOL 300 MG/ML  SOLN
FINDINGS: Lower chest: Lung bases are clear.

Hepatobiliary: Liver parenchyma is normal.  Previous cholecystectomy

Pancreas: Normal

Spleen: Upper limits of normal in size. No significant change since
7558.

Adrenals/Urinary Tract: Adrenal glands are normal. Left kidney is
normal. Right kidney is normal except for a 1 cm cyst in the upper
pole. No stone disease. No hydronephrosis. The bladder is collapsed
but normal.

Stomach/Bowel: Stomach and small intestine are normal. No evidence
of active Crohn's disease. Liquid stool within the colon. No
evidence of colon inflammation, mass or obstruction.

Vascular/Lymphatic: Aortic atherosclerosis, mild. No aneurysm. IVC
is normal. No adenopathy.

Reproductive: IUD present within the uterus. No adnexal mass. 4.1 cm
cyst associated with the left ovary.

Other: No free fluid or air.

Musculoskeletal: Normal except for mild spinal curvature.
IMPRESSION: No sign of active Crohn's disease. Liquid stool in the colon
consistent with diarrhea, without evidence colon inflammation.

Previous cholecystectomy.

IUD in place.

4.2 cm cyst associated with the left ovary. No follow-up imaging
recommended. Note: This recommendation does not apply to
premenarchal patients and to those with increased risk (genetic,
family history, elevated tumor markers or other high-risk factors)
of ovarian cancer. Reference: JACR [DATE]):248-254

## 2023-08-21 ENCOUNTER — Emergency Department (HOSPITAL_BASED_OUTPATIENT_CLINIC_OR_DEPARTMENT_OTHER)
Admission: EM | Admit: 2023-08-21 | Discharge: 2023-08-21 | Disposition: A | Discharge status: Home / Self Care | Payer: PRIVATE HEALTH INSURANCE | Attending: Emergency Medicine | Admitting: Emergency Medicine

## 2023-08-21 ENCOUNTER — Encounter (HOSPITAL_BASED_OUTPATIENT_CLINIC_OR_DEPARTMENT_OTHER): Payer: Self-pay

## 2023-08-21 DIAGNOSIS — L509 Urticaria, unspecified: Secondary | ICD-10-CM | POA: Insufficient documentation

## 2023-08-21 DIAGNOSIS — R21 Rash and other nonspecific skin eruption: Secondary | ICD-10-CM | POA: Diagnosis present

## 2023-08-21 MED ORDER — DIPHENHYDRAMINE HCL 25 MG PO CAPS
50.0000 mg | ORAL_CAPSULE | Freq: Once | ORAL | Status: AC
  Administered 2023-08-21: 50 mg via ORAL
  Filled 2023-08-21: qty 2

## 2023-08-21 NOTE — ED Triage Notes (Signed)
 Pt c/o allergic reaction, advises decadron shot this AM- I don't know what I'm allergic to. Seen at UC this AM, went back this afternoon bc I felt like I was getting worse, seen by same provider & advised to come to ED for worsening rash, nausea, HA, chest tightness, throat feels tight but not like it's swelling.

## 2023-08-21 NOTE — Discharge Instructions (Addendum)
 We evaluated you for your rash.  Your examination is most consistent with hives (urticaria).  Please take 25 to 50 mg of Benadryl every 6 hours as needed for itching.  This medication can make you drowsy so be careful if driving and do not mix with alcohol.  Although we did not identify the specific trigger, keep an eye out for any new exposures to medications, soaps, detergents, cleaning supplies, or pets.  We did not see signs of a severe allergic reaction.  However, if you develop any new symptoms such as trouble breathing, vomiting, abdominal pain, dizziness or fainting, wheezing, or any other concerning symptoms, please return to the emergency department.

## 2023-08-21 NOTE — ED Provider Notes (Signed)
 [place] EMERGENCY DEPARTMENT AT [facility] Provider Note  CSN: [id number] Arrival date & time: 08/21/23 1801  Chief Complaint(s) No chief complaint on file.  HPI DAI APEL is a 43 y.o. female history of Crohn's disease presenting to the emergency department with rash.  Patient developed a rash starting last night, developed scattered itchy erythematous wheals.  Seem to be spreading.  Has tried home Claritin without much improvement.  Initially went to urgent care and received some Decadron.  It was getting worse and she was concerned about it so she went back to the urgent care, urgent care yesterday, to the emergency department.  She reports that she has some sensation of throat swelling but no shortness of breath or wheezing, no vomiting, no diarrhea, no abdominal pain, no syncope or lightheadedness, no swelling.  Does not remember any specific new exposures like new soaps or detergents, new pets, new medications.   Past Medical History Past Medical History:  Diagnosis Date   Allergy    RHINITIS   Contraceptive management    Crohn disease (HCC)    Depression 02/29/2004   Elevated lipids    GAD (generalized anxiety disorder) 06/28/2001   GERD (gastroesophageal reflux disease)    There are no active problems to display for this patient.  Home Medication(s) Prior to Admission medications   Medication Sig Start Date End Date Taking? Authorizing Provider  buPROPion (WELLBUTRIN XL) 150 MG 24 hr tablet Take 150 mg by mouth 2 (two) times daily.      [provider]  escitalopram (LEXAPRO) 10 MG tablet Take 10 mg by mouth daily.      [provider]  norethindrone-ethinyl estradiol (JUNEL FE 1/20) 1-20 MG-MCG per tablet Take 1 tablet by mouth daily.      [provider]  promethazine  (PHENERGAN ) 25 MG tablet Take 1 tablet (25 mg total) by mouth every 6 (six) hours as needed for nausea or vomiting. 03/07/21   Molpus, Norleen, MD                                                                                                                                     Past Surgical History History reviewed. No pertinent surgical history. Family History History reviewed. No pertinent family history.  Social History Social History   Tobacco Use   Smoking status: Never   Smokeless tobacco: Never  Vaping Use   Vaping status: Never Used  Substance Use Topics   Alcohol use: Yes    Comment: ocasional   Allergies Topamax [topiramate]  Review of Systems Review of Systems  All other systems reviewed and are negative.   Physical Exam Vital Signs  I have reviewed the triage vital signs BP 123/77   Pulse 72   Temp (!) 97.4 F (36.3 C)   Resp 19   SpO2 95%  Physical Exam Vitals and nursing note reviewed.  Constitutional:  General: She is not in acute distress.    Appearance: She is well-developed.  HENT:     Head: Normocephalic and atraumatic.     Mouth/Throat:     Mouth: Mucous membranes are moist.     Comments: No oropharyngeal swelling or stridor  Eyes:     Pupils: Pupils are equal, round, and reactive to light.    Cardiovascular:     Rate and Rhythm: Normal rate and regular rhythm.     Heart sounds: No murmur heard. Pulmonary:     Effort: Pulmonary effort is normal. No respiratory distress.     Breath sounds: Normal breath sounds. No wheezing.  Abdominal:     General: Abdomen is flat.     Palpations: Abdomen is soft.     Tenderness: There is no abdominal tenderness.   Musculoskeletal:        General: No tenderness.     Right lower leg: No edema.     Left lower leg: No edema.   Skin:    General: Skin is warm and dry.     Comments: Scattered urticarial/erythematous appearing rash over upper and lower extremities.  No bulla, tenderness, warmth   Neurological:     General: No focal deficit present.     Mental Status: She is alert. Mental status is at baseline.   Psychiatric:        Mood and Affect: Mood  normal.        Behavior: Behavior normal.     ED Results and Treatments Labs (all labs ordered are listed, but only abnormal results are displayed) Labs Reviewed - No data to display                                                                                                                        Radiology No results found.  Pertinent labs & imaging results that were available during my care of the patient were reviewed by me and considered in my medical decision making (see MDM for details).  Medications Ordered in ED Medications  diphenhydrAMINE (BENADRYL) capsule 50 mg (50 mg Oral Given 08/21/23 1909)                                                                                                                                     Procedures Procedures  (including critical care time)  Medical Decision Making / ED Course  MDM:  43 year old presenting to the emergency department with rash.  Patient is overall well-appearing.  Vital signs are reassuring.  Examination with no focal finding other than rash.  Oropharynx clear with no signs of swelling.  No wheezing.    Seems most consistent with urticaria.  Recommended symptomatic treatment with Benadryl.  Do not think patient needs other treatment like parenteral steroids or EpiPen.  No signs of anaphylactic reaction.  Unclear trigger, patient does not remember any specific new exposures.  Recommend follow-up with primary doctor and strict ER precautions.  Given symptoms have been present for 1 day and examination is reassuring, do not think patient needs prescription for EpiPen without signs of anaphylaxis or severe reaction  Will discharge patient to home. All questions answered. Patient comfortable with plan of discharge. Return precautions discussed with patient and specified on the after visit summary.      -External records from outside source obtained and reviewed including: Chart review including previous notes,  labs, imaging, consultation notes including UC notes     Medicines ordered and prescription drug management: Meds ordered this encounter  Medications   diphenhydrAMINE (BENADRYL) capsule 50 mg    -I have reviewed the patients home medicines and have made adjustments as needed   Reevaluation: After the interventions noted above, I reevaluated the patient and found that their symptoms have improved  Co morbidities that complicate the patient evaluation  Past Medical History:  Diagnosis Date   Allergy    RHINITIS   Contraceptive management    Crohn disease (HCC)    Depression 02/29/2004   Elevated lipids    GAD (generalized anxiety disorder) 06/28/2001   GERD (gastroesophageal reflux disease)       Dispostion: Disposition decision including need for hospitalization was considered, and patient discharged from emergency department.    Final Clinical Impression(s) / ED Diagnoses Final diagnoses:  Urticaria     This chart was dictated using voice recognition software.  Despite best efforts to proofread,  errors can occur which can change the documentation meaning.    Francesca Elsie CROME, MD 08/21/23 1910

## 2023-08-27 ENCOUNTER — Encounter (HOSPITAL_BASED_OUTPATIENT_CLINIC_OR_DEPARTMENT_OTHER): Payer: Self-pay | Admitting: Emergency Medicine

## 2023-08-27 ENCOUNTER — Emergency Department (HOSPITAL_BASED_OUTPATIENT_CLINIC_OR_DEPARTMENT_OTHER)
Admission: EM | Admit: 2023-08-27 | Discharge: 2023-08-27 | Disposition: A | Discharge status: Home / Self Care | Payer: PRIVATE HEALTH INSURANCE | Attending: Emergency Medicine | Admitting: Emergency Medicine

## 2023-08-27 DIAGNOSIS — R519 Headache, unspecified: Secondary | ICD-10-CM | POA: Diagnosis present

## 2023-08-27 DIAGNOSIS — G43809 Other migraine, not intractable, without status migrainosus: Secondary | ICD-10-CM | POA: Diagnosis not present

## 2023-08-27 LAB — CBC WITH DIFFERENTIAL/PLATELET
Abs Immature Granulocytes: 0.08 10*3/uL — ABNORMAL HIGH (ref 0.00–0.07)
Basophils Absolute: 0.1 10*3/uL (ref 0.0–0.1)
Basophils Relative: 1 %
Eosinophils Absolute: 0.3 10*3/uL (ref 0.0–0.5)
Eosinophils Relative: 3 %
HCT: 40.2 % (ref 36.0–46.0)
Hemoglobin: 14.1 g/dL (ref 12.0–15.0)
Immature Granulocytes: 1 %
Lymphocytes Relative: 26 %
Lymphs Abs: 2.6 10*3/uL (ref 0.7–4.0)
MCH: 31.5 pg (ref 26.0–34.0)
MCHC: 35.1 g/dL (ref 30.0–36.0)
MCV: 89.7 fL (ref 80.0–100.0)
Monocytes Absolute: 0.5 10*3/uL (ref 0.1–1.0)
Monocytes Relative: 5 %
Neutro Abs: 6.4 10*3/uL (ref 1.7–7.7)
Neutrophils Relative %: 64 %
Platelets: 225 10*3/uL (ref 150–400)
RBC: 4.48 MIL/uL (ref 3.87–5.11)
RDW: 12.7 % (ref 11.5–15.5)
WBC: 9.9 10*3/uL (ref 4.0–10.5)
nRBC: 0 % (ref 0.0–0.2)

## 2023-08-27 LAB — BASIC METABOLIC PANEL WITH GFR
Anion gap: 10 (ref 5–15)
BUN: 19 mg/dL (ref 6–20)
CO2: 22 mmol/L (ref 22–32)
Calcium: 9.6 mg/dL (ref 8.9–10.3)
Chloride: 105 mmol/L (ref 98–111)
Creatinine, Ser: 0.88 mg/dL (ref 0.44–1.00)
GFR, Estimated: >60 mL/min (ref >60)
Glucose, Bld: 89 mg/dL (ref 70–99)
Potassium: 4.2 mmol/L (ref 3.5–5.1)
Sodium: 136 mmol/L (ref 135–145)

## 2023-08-27 LAB — PREGNANCY, URINE: Preg Test, Ur: NEGATIVE

## 2023-08-27 LAB — MAGNESIUM: Magnesium: 2.2 mg/dL (ref 1.7–2.4)

## 2023-08-27 MED ORDER — METOCLOPRAMIDE HCL 5 MG/ML IJ SOLN
10.0000 mg | Freq: Once | INTRAMUSCULAR | Status: AC
  Administered 2023-08-27: 10 mg via INTRAVENOUS
  Filled 2023-08-27: qty 2

## 2023-08-27 MED ORDER — SODIUM CHLORIDE 0.9 % IV BOLUS
1000.0000 mL | Freq: Once | INTRAVENOUS | Status: AC
  Administered 2023-08-27: 1000 mL via INTRAVENOUS

## 2023-08-27 MED ORDER — DEXAMETHASONE SODIUM PHOSPHATE 10 MG/ML IJ SOLN
8.0000 mg | Freq: Once | INTRAMUSCULAR | Status: AC
  Administered 2023-08-27: 8 mg via INTRAMUSCULAR
  Filled 2023-08-27: qty 1

## 2023-08-27 MED ORDER — ACETAMINOPHEN 325 MG PO TABS
650.0000 mg | ORAL_TABLET | Freq: Once | ORAL | Status: AC
  Administered 2023-08-27: 650 mg via ORAL
  Filled 2023-08-27: qty 2

## 2023-08-27 NOTE — Discharge Instructions (Addendum)
I am glad you are feeling better. Please follow up with your primary care provider. Seek emergency care if experiencing any new or worsening symptoms.

## 2023-08-27 NOTE — ED Triage Notes (Signed)
 Pt caox4, ambulatory c/o headache, dizziness on movement, floaters and nausea since last night. Just finished prednisone pack yesterday.  PMH migraines but has not had one in a while.

## 2023-08-27 NOTE — ED Provider Notes (Signed)
 [place] EMERGENCY DEPARTMENT AT [facility] Provider Note   CSN: [id number] Arrival date & time: 08/27/23  9080     Patient presents with: Headache   Ann Leonard is a 43 y.o. female with PMHx depression, anxiety, Crohn's disease, HLD, GERD, complex migraines who presents to ED concerned for frontal headache and nausea since last night. Patient with hx of migraines but states that they have not had a migraine in a while. Patient has taken Maxalt for the headache yesterday which did provide some relief.  However, patient is concerned because she woke up again this morning with headache that continues to gradually worsen despite taking tylenol - no thunderclap headache. Patient also endorsing nausea and intermittent floaters in her vision that are not currently present. Patient currently without vision changes. Patient also endorsing mild vertigo when she moves her head quickly.   Patient recently stopped a prednisone pack yesterday for an allergic reaction to Remicade. This was a 5 day taper and the last dose was 10mg  yesterday.   Patient's migraines usually present with unilateral weakness and nausea.   Denies fever, cough, vomiting, diarrhea. Denies neck pain.      Headache      Prior to Admission medications   Medication Sig Start Date End Date Taking? Authorizing Provider  busPIRone (BUSPAR) 5 MG tablet Take 10 mg by mouth. 11/02/21 11/17/23 Yes [provider]  cetirizine (ZYRTEC) 10 MG tablet TAKE 1 TABLET BY MOUTH AT BEDTIME PRN 11/21/16  Yes [provider]  buPROPion (WELLBUTRIN XL) 150 MG 24 hr tablet Take 150 mg by mouth 2 (two) times daily.      [provider]  escitalopram (LEXAPRO) 10 MG tablet Take 10 mg by mouth daily.      [provider]  norethindrone-ethinyl estradiol (JUNEL FE 1/20) 1-20 MG-MCG per tablet Take 1 tablet by mouth daily.      [provider]  promethazine  (PHENERGAN ) 25 MG tablet Take 1  tablet (25 mg total) by mouth every 6 (six) hours as needed for nausea or vomiting. 03/07/21   Molpus, Norleen, MD    Allergies: Topamax [topiramate]    Review of Systems  Neurological:  Positive for headaches.    Updated Vital Signs BP 120/70 (BP Location: Right Arm)   Pulse 77   Temp 98.7 F (37.1 C) (Oral)   Resp 18   Ht 5' 5 (1.651 m)   Wt 93 kg   SpO2 96%   BMI 34.11 kg/m   Physical Exam Vitals and nursing note reviewed.  Constitutional:      General: She is not in acute distress.    Appearance: She is not ill-appearing or toxic-appearing.  HENT:     Head: Normocephalic and atraumatic.     Mouth/Throat:     Mouth: Mucous membranes are moist.     Pharynx: No oropharyngeal exudate or posterior oropharyngeal erythema.   Eyes:     General: No scleral icterus.       Right eye: No discharge.        Left eye: No discharge.     Extraocular Movements: Extraocular movements intact.     Conjunctiva/sclera: Conjunctivae normal.     Pupils: Pupils are equal, round, and reactive to light.     Comments: No visual field deficits   Cardiovascular:     Rate and Rhythm: Normal rate and regular rhythm.     Pulses: Normal pulses.     Heart sounds: Normal heart sounds. No murmur  heard. Pulmonary:     Effort: Pulmonary effort is normal. No respiratory distress.     Breath sounds: Normal breath sounds. No wheezing, rhonchi or rales.  Abdominal:     General: Bowel sounds are normal. There is no distension.     Palpations: Abdomen is soft. There is no mass.     Tenderness: There is no abdominal tenderness.   Musculoskeletal:     Cervical back: Normal range of motion and neck supple.     Right lower leg: No edema.     Left lower leg: No edema.   Skin:    General: Skin is warm and dry.     Findings: No rash.   Neurological:     General: No focal deficit present.     Mental Status: She is alert. Mental status is at baseline.     Comments: GCS 15. Speech is goal oriented. No  deficits appreciated to CN III-XII; symmetric eyebrow raise, no facial drooping, tongue midline. Patient has equal grip strength bilaterally with 5/5 strength against resistance in all major muscle groups bilaterally. Sensation to light touch intact. Patient moves extremities without ataxia.   Psychiatric:        Mood and Affect: Mood normal.        Behavior: Behavior normal.     (all labs ordered are listed, but only abnormal results are displayed) Labs Reviewed  CBC WITH DIFFERENTIAL/PLATELET - Abnormal; Notable for the following components:      Result Value   Abs Immature Granulocytes 0.08 (*)    All other components within normal limits  BASIC METABOLIC PANEL WITH GFR  MAGNESIUM  PREGNANCY, URINE    EKG: None  Radiology: No results found.   Procedures   Medications Ordered in the ED  sodium chloride  0.9 % bolus 1,000 mL (1,000 mLs Intravenous New Bag/Given 08/27/23 1105)  dexamethasone (DECADRON) injection 8 mg (8 mg Intramuscular Given 08/27/23 1106)  acetaminophen (TYLENOL) tablet 650 mg (650 mg Oral Given 08/27/23 1111)  metoCLOPramide (REGLAN) injection 10 mg (10 mg Intravenous Given 08/27/23 1106)                                    Medical Decision Making Amount and/or Complexity of Data Reviewed Labs: ordered.  Risk OTC drugs. Prescription drug management.   This patient presents to the ED for concern of headache, this involves an extensive number of treatment options, and is a complaint that carries with it a high risk of complications and morbidity.  The differential diagnosis includes migraine, tension headache, cluster headache, subarachnoid hemorrhage, meningitis/encephalitis, ischemic stroke, ICH, cervical artery dissection   Co morbidities that complicate the patient evaluation  depression, anxiety, Crohn's disease, HLD, GERD, complex migraines    Additional history obtained:  Dr. Cheree PCP   Problem List / ED Course / Critical interventions  / Medication management  Patient presents to ED with headache.  Physical/neuroexam reassuring.  Patient afebrile with stable vitals. I Ordered, and personally interpreted labs.  Urine pregnancy negative.  CBC without leukocytosis or anemia.  BMP reassuring.  Magnesium 2.2. Provided patient with migraine cocktail.  Patient now feeling better and states that she is ready to go home.  Patient to follow-up with PCP. Staffed with Dr. Lenor. I have reviewed the patients home medicines and have made adjustments as needed The patient has been appropriately medically screened and/or stabilized in the ED. I have low suspicion  for any other emergent medical condition which would require further screening, evaluation or treatment in the ED or require inpatient management. At time of discharge the patient is hemodynamically stable and in no acute distress. I have discussed work-up results and diagnosis with patient and answered all questions. Patient is agreeable with discharge plan. We discussed strict return precautions for returning to the emergency department and they verbalized understanding.    Ddx: these are considered less likely due to history of present illness and physical exam -subarachnoid hemorrhage: no neurodeficits, vomiting -meningitis/encephalitis: stable vital signs,  lack of meningismus symptoms -ischemic stroke/ICH/cervical artery dissection: no neurodeficits   Social Determinants of Health:  none       Final diagnoses:  Other migraine without status migrainosus, not intractable    ED Discharge Orders     None          Hoy Nidia FALCON, [place] 08/27/23 1353    Lenor Hollering, MD 08/27/23 1432

## 2023-08-27 NOTE — ED Notes (Signed)
 Discharge instructions and follow up care reviewed and explained, pt verbalized understanding. Pt caox4, ambulatory NAD on d/c.

## 2023-11-25 ENCOUNTER — Other Ambulatory Visit: Payer: Self-pay

## 2023-11-25 ENCOUNTER — Emergency Department (HOSPITAL_BASED_OUTPATIENT_CLINIC_OR_DEPARTMENT_OTHER)
Admission: EM | Admit: 2023-11-25 | Discharge: 2023-11-25 | Disposition: A | Discharge status: Home / Self Care | Payer: PRIVATE HEALTH INSURANCE | Attending: Emergency Medicine | Admitting: Emergency Medicine

## 2023-11-25 ENCOUNTER — Emergency Department (HOSPITAL_BASED_OUTPATIENT_CLINIC_OR_DEPARTMENT_OTHER): Payer: PRIVATE HEALTH INSURANCE

## 2023-11-25 DIAGNOSIS — R109 Unspecified abdominal pain: Secondary | ICD-10-CM | POA: Diagnosis present

## 2023-11-25 DIAGNOSIS — K5900 Constipation, unspecified: Secondary | ICD-10-CM | POA: Insufficient documentation

## 2023-11-25 LAB — COMPREHENSIVE METABOLIC PANEL WITH GFR
ALT: 26 U/L (ref 0–44)
AST: 22 U/L (ref 15–41)
Albumin: 4.7 g/dL (ref 3.5–5.0)
Alkaline Phosphatase: 109 U/L (ref 38–126)
Anion gap: 13 (ref 5–15)
BUN: 15 mg/dL (ref 6–20)
CO2: 23 mmol/L (ref 22–32)
Calcium: 9.9 mg/dL (ref 8.9–10.3)
Chloride: 103 mmol/L (ref 98–111)
Creatinine, Ser: 0.86 mg/dL (ref 0.44–1.00)
GFR, Estimated: >60 mL/min (ref >60)
Glucose, Bld: 90 mg/dL (ref 70–99)
Potassium: 4.1 mmol/L (ref 3.5–5.1)
Sodium: 139 mmol/L (ref 135–145)
Total Bilirubin: 1.1 mg/dL (ref 0.0–1.2)
Total Protein: 7.8 g/dL (ref 6.5–8.1)

## 2023-11-25 LAB — URINALYSIS, ROUTINE W REFLEX MICROSCOPIC
Bilirubin Urine: NEGATIVE
Glucose, UA: NEGATIVE mg/dL
Hgb urine dipstick: NEGATIVE
Ketones, ur: NEGATIVE mg/dL
Leukocytes,Ua: NEGATIVE
Nitrite: NEGATIVE
Protein, ur: NEGATIVE mg/dL
Specific Gravity, Urine: 1.018 (ref 1.005–1.030)
pH: 7 (ref 5.0–8.0)

## 2023-11-25 LAB — CBC
HCT: 41.9 % (ref 36.0–46.0)
Hemoglobin: 14.4 g/dL (ref 12.0–15.0)
MCH: 30.9 pg (ref 26.0–34.0)
MCHC: 34.4 g/dL (ref 30.0–36.0)
MCV: 89.9 fL (ref 80.0–100.0)
Platelets: 198 K/uL (ref 150–400)
RBC: 4.66 MIL/uL (ref 3.87–5.11)
RDW: 12.6 % (ref 11.5–15.5)
WBC: 8.1 K/uL (ref 4.0–10.5)
nRBC: 0 % (ref 0.0–0.2)

## 2023-11-25 LAB — LIPASE, BLOOD: Lipase: 65 U/L — ABNORMAL HIGH (ref 11–51)

## 2023-11-25 MED ORDER — DIPHENHYDRAMINE HCL 25 MG PO CAPS
25.0000 mg | ORAL_CAPSULE | Freq: Once | ORAL | Status: AC
Start: 2023-11-25 — End: 2023-11-25
  Administered 2023-11-25: 25 mg via ORAL
  Filled 2023-11-25: qty 1

## 2023-11-25 MED ORDER — LACTATED RINGERS IV BOLUS
1000.0000 mL | Freq: Once | INTRAVENOUS | Status: AC
  Administered 2023-11-25: 1000 mL via INTRAVENOUS

## 2023-11-25 MED ORDER — METOCLOPRAMIDE HCL 5 MG/ML IJ SOLN
10.0000 mg | Freq: Once | INTRAMUSCULAR | Status: AC
  Administered 2023-11-25: 10 mg via INTRAVENOUS
  Filled 2023-11-25: qty 2

## 2023-11-25 NOTE — ED Provider Notes (Signed)
  EMERGENCY DEPARTMENT AT [facility] Provider Note   CSN: [id number] Arrival date & time: 11/25/23  1543     Patient presents with: Constipation and Abdominal Pain   Ann Leonard is a 43 y.o. female.    Constipation Associated symptoms: abdominal pain   Abdominal Pain Associated symptoms: constipation    Patient is a 43 year old female with past medical history significant for chronic constipation takes Linzess and another constipation med.   Patient states that she has had some crampy abdominal pain today and nausea and no bowel movement over the past 5 days she has a history of chronic constipation she tried some magnesium citrate with no BM.  She denies any current pain no fevers chills vomiting no vaginal discharge or pelvic pain.  She denies any urinary frequency urgency dysuria or hematuria.      Prior to Admission medications   Medication Sig Start Date End Date Taking? Authorizing Provider  buPROPion (WELLBUTRIN XL) 150 MG 24 hr tablet Take 150 mg by mouth 2 (two) times daily.      [provider]  cetirizine (ZYRTEC) 10 MG tablet TAKE 1 TABLET BY MOUTH AT BEDTIME PRN 11/21/16   [provider]  escitalopram (LEXAPRO) 10 MG tablet Take 10 mg by mouth daily.      [provider]  norethindrone-ethinyl estradiol (JUNEL FE 1/20) 1-20 MG-MCG per tablet Take 1 tablet by mouth daily.      [provider]  promethazine  (PHENERGAN ) 25 MG tablet Take 1 tablet (25 mg total) by mouth every 6 (six) hours as needed for nausea or vomiting. 03/07/21   Molpus, John, MD    Allergies: Topamax [topiramate]    Review of Systems  Gastrointestinal:  Positive for abdominal pain and constipation.    Updated Vital Signs BP 132/89 (BP Location: Right Arm)   Pulse 68   Temp 98.3 F (36.8 C) (Oral)   Resp 17   Ht 5' 5 (1.651 m)   Wt 89.8 kg   SpO2 99%   BMI 32.95 kg/m   Physical Exam Vitals and nursing note reviewed.   Constitutional:      General: She is not in acute distress. HENT:     Head: Normocephalic and atraumatic.     Nose: Nose normal.  Eyes:     General: No scleral icterus. Cardiovascular:     Rate and Rhythm: Normal rate and regular rhythm.     Pulses: Normal pulses.     Heart sounds: Normal heart sounds.  Pulmonary:     Effort: Pulmonary effort is normal. No respiratory distress.     Breath sounds: No wheezing.  Abdominal:     Palpations: Abdomen is soft.     Tenderness: There is no abdominal tenderness.  Musculoskeletal:     Cervical back: Normal range of motion.     Right lower leg: No edema.     Left lower leg: No edema.  Skin:    General: Skin is warm and dry.     Capillary Refill: Capillary refill takes less than 2 seconds.  Neurological:     Mental Status: She is alert. Mental status is at baseline.  Psychiatric:        Mood and Affect: Mood normal.        Behavior: Behavior normal.     (all labs ordered are listed, but only abnormal results are displayed) Labs Reviewed  LIPASE, BLOOD - Abnormal; Notable for the following components:  Result Value   Lipase 65 (*)    All other components within normal limits  COMPREHENSIVE METABOLIC PANEL WITH GFR  CBC  URINALYSIS, ROUTINE W REFLEX MICROSCOPIC    EKG: None  Radiology: CT Renal Stone Study Result Date: 11/25/2023 CLINICAL DATA:  Diffuse abdominal discomfort. EXAM: CT ABDOMEN AND PELVIS WITHOUT CONTRAST TECHNIQUE: Multidetector CT imaging of the abdomen and pelvis was performed following the standard protocol without IV contrast. RADIATION DOSE REDUCTION: This exam was performed according to the departmental dose-optimization program which includes automated exposure control, adjustment of the mA and/or kV according to patient size and/or use of iterative reconstruction technique. COMPARISON:  May 13, 2021 FINDINGS: Lower chest: No acute abnormality. Hepatobiliary: No focal liver abnormality is seen. Status  post cholecystectomy. No biliary dilatation. Pancreas: Unremarkable. No pancreatic ductal dilatation or surrounding inflammatory changes. Spleen: Stable, upper limit of normal in size without focal abnormality. Adrenals/Urinary Tract: Adrenal glands are unremarkable. Kidneys are normal, without renal calculi, focal lesion, or hydronephrosis. Bladder is unremarkable. Stomach/Bowel: Stomach is within normal limits. Appendix appears normal. A moderate stool burden is noted. No evidence of bowel wall thickening, distention, or inflammatory changes. Vascular/Lymphatic: Mild aortic atherosclerosis. No enlarged abdominal or pelvic lymph nodes. Reproductive: Status post hysterectomy. No adnexal masses. Other: No abdominal wall hernia or abnormality. No abdominopelvic ascites. Musculoskeletal: No acute or significant osseous findings. IMPRESSION: 1. Moderate stool burden without evidence of bowel obstruction. 2. Evidence of prior cholecystectomy and hysterectomy. 3. Aortic atherosclerosis. Electronically Signed   By: Suzen Dials M.D.   On: 11/25/2023 18:09     Procedures   Medications Ordered in the ED  lactated ringers  bolus 1,000 mL (1,000 mLs Intravenous New Bag/Given 11/25/23 1755)  metoCLOPramide  (REGLAN ) injection 10 mg (10 mg Intravenous Given 11/25/23 1755)  diphenhydrAMINE  (BENADRYL ) capsule 25 mg (25 mg Oral Given 11/25/23 1752)    Clinical Course as of 11/25/23 1836  Sat Nov 25, 2023  1715 Crampy abd pain today no bm in 5 days Takes linzess and emptizza  [WF]    Clinical Course User Index [WF] Neldon Hamp RAMAN, [place]                                 Medical Decision Making Amount and/or Complexity of Data Reviewed Labs: ordered. Radiology: ordered.  Risk Prescription drug management.    This patient presents to the ED for concern of abd pain, this involves a number of treatment options, and is a complaint that carries with it a moderate risk of complications and morbidity. A  differential diagnosis was considered for the patient's symptoms which is discussed below:   The causes of generalized abdominal pain include but are not limited to AAA, mesenteric ischemia, appendicitis, diverticulitis, DKA, gastritis, gastroenteritis, AMI, nephrolithiasis, pancreatitis, peritonitis, adrenal insufficiency,lead poisoning, iron toxicity, intestinal ischemia, constipation, UTI,SBO/LBO, splenic rupture, biliary disease, IBD, IBS, PUD, or hepatitis.    Co morbidities: Discussed in HPI   Brief History:  Patient is a 43 year old female with past medical history significant for chronic constipation takes Linzess and another constipation med.   Patient states that she has had some crampy abdominal pain today and nausea and no bowel movement over the past 5 days she has a history of chronic constipation she tried some magnesium citrate with no BM.  She denies any current pain no fevers chills vomiting no vaginal discharge or pelvic pain.  She denies any urinary frequency urgency dysuria or  hematuria.    EMR reviewed including pt PMHx, past surgical history and past visits to ER.   See HPI for more details   Lab Tests:   I personally reviewed all laboratory work and imaging. Metabolic panel without any acute abnormality specifically kidney function within normal limits and no significant electrolyte abnormalities. CBC without leukocytosis or significant anemia. Lipase and urine normal  Imaging Studies:  Abnormal findings. I personally reviewed all imaging studies. Imaging notable for  IMPRESSION:  1. Moderate stool burden without evidence of bowel obstruction.  2. Evidence of prior cholecystectomy and hysterectomy.  3. Aortic atherosclerosis.    Cardiac Monitoring:  NA NA   Medicines ordered:  I ordered medication including lactated Ringer 's, Reglan , Benadryl  for nausea, hydration Reevaluation of the patient after these medicines showed that the patient  resolved I have reviewed the patients home medicines and have made adjustments as needed   Critical Interventions:  .   Consults/Attending Physician   .   Reevaluation:  After the interventions noted above I re-evaluated patient and found that they have : Resolved   Social Determinants of Health:  .    Problem List / ED Course:  Crampy abdominal pain some nausea no vomiting and no bowel movement in 5 days.  Patient has reassuring workup.  Offered rectal exam versus CT to evaluate for stool ball.  She would prefer the latter.  Showed no appreciable stool ball in rectum.  Does have diffuse constipation and stool.  Recommend MiraLAX and show need to do a GI cleanout regimen including 5 caps of MiraLAX.  She will do this at home.  Recommend hydration and gentle exercise.  Follow-up with primary care.  Continue taking her chronic constipation medications.   Dispostion:  After consideration of the diagnostic results and the patients response to treatment, I feel that the patent would benefit from close outpatient follow-up.      Final diagnoses:  Constipation, unspecified constipation type    ED Discharge Orders     None          Neldon Hamp RAMAN, [place] 11/25/23 1839    Yolande Lamar BROCKS, MD 11/27/23 1112

## 2023-11-25 NOTE — Discharge Instructions (Signed)
Cleanout:  Miralax cleanout 5 capfuls in 24-36 oz gatorade, drink over 4-6 hrs. If stool is not clear after 24 hours, then repeat this dose for a second day.   After Cleanout:  Give Miralax 1 capful in 8 oz juice or gatorade daily for at least 4-6 weeks.  Schedule follow up with pediatrician if no improvement in constipation in 1-2 months, sooner if not resolved after cleanout.    GETTING TO GOOD BOWEL HEALTH. Irregular bowel habits such as constipation and diarrhea can lead to many problems over time.  Having one soft bowel movement a day is the most important way to prevent further problems.  The anorectal canal is designed to handle stretching and feces to safely manage our ability to get rid of solid waste (feces, poop, stool) out of our body.  BUT, hard constipated stools can act like ripping concrete bricks and diarrhea can be a burning fire to this very sensitive area of our body, causing inflamed hemorrhoids, anal fissures, increasing risk is perirectal abscesses, abdominal pain/bloating, an making irritable bowel worse.     The goal: ONE SOFT BOWEL MOVEMENT A DAY!  To have soft, regular bowel movements:  Drink at least 8 tall glasses of water a day.   Take plenty of fiber.  Fiber is the undigested part of plant food that passes into the colon, acting s "natures broom" to encourage bowel motility and movement.  Fiber can absorb and hold large amounts of water. This results in a larger, bulkier stool, which is soft and easier to pass. Work gradually over several weeks up to 6 servings a day of fiber (25g a day even more if needed) in the form of: Vegetables -- Root (potatoes, carrots, turnips), leafy green (lettuce, salad greens, celery, spinach), or cooked high residue (cabbage, broccoli, etc) Fruit -- Fresh (unpeeled skin & pulp), Dried (prunes, apricots, cherries, etc ),  or stewed ( applesauce)  Whole grain breads, pasta, etc (whole wheat)  Bran cereals  Bulking Agents -- This type of  water-retaining fiber generally is easily obtained each day by one of the following:  Psyllium bran -- The psyllium plant is remarkable because its ground seeds can retain so much water. This product is available as Metamucil, Konsyl, Effersyllium, Per Diem Fiber, or the less expensive generic preparation in drug and health food stores. Although labeled a laxative, it really is not a laxative.  Methylcellulose -- This is another fiber derived from wood which also retains water. It is available as Citrucel. Polyethylene Glycol - and "artificial" fiber commonly called Miralax or Glycolax.  It is helpful for people with gassy or bloated feelings with regular fiber Flax Seed - a less gassy fiber than psyllium No reading or other relaxing activity while on the toilet. If bowel movements take longer than 5 minutes, you are too constipated AVOID CONSTIPATION.  High fiber and water intake usually takes care of this.  Sometimes a laxative is needed to stimulate more frequent bowel movements, but  Laxatives are not a good long-term solution as it can wear the colon out. Osmotics (Milk of Magnesia, Fleets phosphosoda, Magnesium citrate, MiraLax, GoLytely) are safer than  Stimulants (Senokot, Castor Oil, Dulcolax, Ex Lax)    Do not take laxatives for more than 7days in a row.  IF SEVERELY CONSTIPATED, try a Bowel Retraining Program: Do not use laxatives.  Eat a diet high in roughage, such as bran cereals and leafy vegetables.  Drink six (6) ounces of prune or apricot juice  each morning.  Eat two (2) large servings of stewed fruit each day.  Take one (1) heaping tablespoon of a psyllium-based bulking agent twice a day. Use sugar-free sweetener when possible to avoid excessive calories.  Eat a normal breakfast.  Set aside 15 minutes after breakfast to sit on the toilet, but do not strain to have a bowel movement.  If you do not have a bowel movement by the third day, use an enema and repeat the above steps.   Controlling diarrhea Switch to liquids and simpler foods for a few days to avoid stressing your intestines further. Avoid dairy products (especially milk & ice cream) for a short time.  The intestines often can lose the ability to digest lactose when stressed. Avoid foods that cause gassiness or bloating.  Typical foods include beans and other legumes, cabbage, broccoli, and dairy foods.  Every person has some sensitivity to other foods, so listen to our body and avoid those foods that trigger problems for you. Adding fiber (Citrucel, Metamucil, psyllium, Miralax) gradually can help thicken stools by absorbing excess fluid and retrain the intestines to act more normally.  Slowly increase the dose over a few weeks.  Too much fiber too soon can backfire and cause cramping & bloating. Probiotics (such as active yogurt, Align, etc) may help repopulate the intestines and colon with normal bacteria and calm down a sensitive digestive tract.  Most studies show it to be of mild help, though, and such products can be costly. Medicines: Bismuth subsalicylate (ex. Kayopectate, Pepto Bismol) every 30 minutes for up to 6 doses can help control diarrhea.  Avoid if pregnant. Loperamide (Immodium) can slow down diarrhea.  Start with two tablets (4mg  total) first and then try one tablet every 6 hours.  Avoid if you are having fevers or severe pain.  If you are not better or start feeling worse, stop all medicines and call your doctor for advice Call your doctor if you are getting worse or not better.  Sometimes further testing (cultures, endoscopy, X-ray studies, bloodwork, etc) may be needed to help diagnose and treat the cause of the diarrhea.  Managing Pain  Pain after surgery or related to activity is often due to strain/injury to muscle, tendon, nerves and/or incisions.  This pain is usually short-term and will improve in a few months.   Many people find it helpful to do the following things TOGETHER to help  speed the process of healing and to get back to regular activity more quickly:  Avoid heavy physical activity  no lifting greater than 20 pounds Do not "push through" the pain.  Listen to your body and avoid positions and maneuvers than reproduce the pain Walking is okay as tolerated, but go slowly and stop when getting sore.  Remember: If it hurts to do it, then don't do it! Take Anti-inflammatory medication  Take with food/snack around the clock for 1-2 weeks This helps the muscle and nerve tissues become less irritable and calm down faster Choose ONE of the following over-the-counter medications: Naproxen 220mg  tabs (ex. Aleve) 1-2 pills twice a day  Ibuprofen 200mg  tabs (ex. Advil, Motrin) 3-4 pills with every meal and just before bedtime Acetaminophen 500mg  tabs (Tylenol) 1-2 pills with every meal and just before bedtime Use a Heating pad or Ice/Cold Pack 4-6 times a day May use warm bath/hottub  or showers Try Gentle Massage and/or Stretching  at the area of pain many times a day stop if you feel pain - do  not overdo it  Try these steps together to help you body heal faster and avoid making things get worse.  Doing just one of these things may not be enough.    If you are not getting better after two weeks or are noticing you are getting worse, contact our office for further advice; we may need to re-evaluate you & see what other things we can do to help.

## 2023-11-25 NOTE — ED Triage Notes (Signed)
 Pt POV reporting constipation and abd pain related to Chron's, last BM 5 days ago, seen at [facility] and advised to come to ED for further evaluation.
